# Patient Record
Sex: Male | Born: 1940 | Race: White | Hispanic: No | Smoking: Current every day smoker
Health system: Southern US, Community
[De-identification: ages and names within clinical notes are randomized; demographics above are authoritative.]

## PROBLEM LIST (undated history)

## (undated) DIAGNOSIS — M1990 Unspecified osteoarthritis, unspecified site: Secondary | ICD-10-CM

## (undated) DIAGNOSIS — I10 Essential (primary) hypertension: Secondary | ICD-10-CM

## (undated) DIAGNOSIS — C4490 Unspecified malignant neoplasm of skin, unspecified: Secondary | ICD-10-CM

## (undated) DIAGNOSIS — J449 Chronic obstructive pulmonary disease, unspecified: Secondary | ICD-10-CM

## (undated) DIAGNOSIS — A630 Anogenital (venereal) warts: Secondary | ICD-10-CM

## (undated) DIAGNOSIS — E785 Hyperlipidemia, unspecified: Secondary | ICD-10-CM

## (undated) HISTORY — PX: TONSILLECTOMY: SUR1361

## (undated) HISTORY — DX: Unspecified osteoarthritis, unspecified site: M19.90

## (undated) HISTORY — DX: Essential (primary) hypertension: I10

## (undated) HISTORY — DX: Hyperlipidemia, unspecified: E78.5

## (undated) HISTORY — DX: Chronic obstructive pulmonary disease, unspecified: J44.9

## (undated) HISTORY — DX: Unspecified malignant neoplasm of skin, unspecified: C44.90

## (undated) HISTORY — PX: INGUINAL HERNIA REPAIR: SUR1180

## (undated) HISTORY — DX: Anogenital (venereal) warts: A63.0

## (undated) HISTORY — PX: NASAL SINUS SURGERY: SHX719

---

## 1998-12-29 ENCOUNTER — Emergency Department (HOSPITAL_COMMUNITY): Admission: EM | Admit: 1998-12-29 | Discharge: 1998-12-29 | Payer: Self-pay | Admitting: Emergency Medicine

## 1998-12-30 ENCOUNTER — Encounter: Payer: Self-pay | Admitting: Emergency Medicine

## 2000-09-19 ENCOUNTER — Encounter: Payer: Self-pay | Admitting: Emergency Medicine

## 2000-09-19 ENCOUNTER — Encounter: Admission: RE | Admit: 2000-09-19 | Discharge: 2000-09-19 | Payer: Self-pay | Admitting: Emergency Medicine

## 2001-01-02 ENCOUNTER — Encounter (INDEPENDENT_AMBULATORY_CARE_PROVIDER_SITE_OTHER): Payer: Self-pay | Admitting: Specialist

## 2001-01-02 ENCOUNTER — Ambulatory Visit (HOSPITAL_BASED_OUTPATIENT_CLINIC_OR_DEPARTMENT_OTHER): Admission: RE | Admit: 2001-01-02 | Discharge: 2001-01-02 | Payer: Self-pay | Admitting: Plastic Surgery

## 2001-12-29 ENCOUNTER — Emergency Department (HOSPITAL_COMMUNITY): Admission: EM | Admit: 2001-12-29 | Discharge: 2001-12-29 | Payer: Self-pay

## 2003-02-11 ENCOUNTER — Emergency Department (HOSPITAL_COMMUNITY): Admission: EM | Admit: 2003-02-11 | Discharge: 2003-02-12 | Payer: Self-pay | Admitting: Emergency Medicine

## 2003-02-12 ENCOUNTER — Encounter: Payer: Self-pay | Admitting: Emergency Medicine

## 2006-01-14 ENCOUNTER — Emergency Department (HOSPITAL_COMMUNITY): Admission: EM | Admit: 2006-01-14 | Discharge: 2006-01-15 | Payer: Self-pay | Admitting: Emergency Medicine

## 2006-02-19 ENCOUNTER — Ambulatory Visit (HOSPITAL_COMMUNITY): Admission: RE | Admit: 2006-02-19 | Discharge: 2006-02-19 | Payer: Self-pay | Admitting: Emergency Medicine

## 2007-02-14 ENCOUNTER — Ambulatory Visit (HOSPITAL_COMMUNITY): Admission: RE | Admit: 2007-02-14 | Discharge: 2007-02-14 | Payer: Self-pay | Admitting: Ophthalmology

## 2007-03-28 ENCOUNTER — Ambulatory Visit (HOSPITAL_BASED_OUTPATIENT_CLINIC_OR_DEPARTMENT_OTHER): Admission: RE | Admit: 2007-03-28 | Discharge: 2007-03-28 | Payer: Self-pay | Admitting: Urology

## 2007-03-28 ENCOUNTER — Encounter (INDEPENDENT_AMBULATORY_CARE_PROVIDER_SITE_OTHER): Payer: Self-pay | Admitting: Urology

## 2007-04-25 DIAGNOSIS — A630 Anogenital (venereal) warts: Secondary | ICD-10-CM

## 2007-04-25 HISTORY — PX: OTHER SURGICAL HISTORY: SHX169

## 2007-04-25 HISTORY — DX: Anogenital (venereal) warts: A63.0

## 2007-09-23 ENCOUNTER — Encounter (INDEPENDENT_AMBULATORY_CARE_PROVIDER_SITE_OTHER): Payer: Self-pay | Admitting: Urology

## 2007-09-23 ENCOUNTER — Ambulatory Visit (HOSPITAL_BASED_OUTPATIENT_CLINIC_OR_DEPARTMENT_OTHER): Admission: RE | Admit: 2007-09-23 | Discharge: 2007-09-24 | Payer: Self-pay | Admitting: Urology

## 2008-01-11 ENCOUNTER — Emergency Department (HOSPITAL_COMMUNITY): Admission: EM | Admit: 2008-01-11 | Discharge: 2008-01-11 | Payer: Self-pay | Admitting: Emergency Medicine

## 2008-09-29 ENCOUNTER — Ambulatory Visit: Payer: Self-pay | Admitting: Internal Medicine

## 2008-09-29 ENCOUNTER — Encounter (INDEPENDENT_AMBULATORY_CARE_PROVIDER_SITE_OTHER): Payer: Self-pay | Admitting: *Deleted

## 2008-09-29 DIAGNOSIS — E785 Hyperlipidemia, unspecified: Secondary | ICD-10-CM

## 2008-09-29 DIAGNOSIS — M1990 Unspecified osteoarthritis, unspecified site: Secondary | ICD-10-CM

## 2008-09-29 DIAGNOSIS — J449 Chronic obstructive pulmonary disease, unspecified: Secondary | ICD-10-CM

## 2008-09-29 DIAGNOSIS — Z85828 Personal history of other malignant neoplasm of skin: Secondary | ICD-10-CM

## 2008-09-29 DIAGNOSIS — K5730 Diverticulosis of large intestine without perforation or abscess without bleeding: Secondary | ICD-10-CM | POA: Insufficient documentation

## 2008-09-29 DIAGNOSIS — I10 Essential (primary) hypertension: Secondary | ICD-10-CM

## 2008-09-29 LAB — CONVERTED CEMR LAB: LDL Goal: 130 mg/dL

## 2008-10-27 ENCOUNTER — Ambulatory Visit: Payer: Self-pay | Admitting: Gastroenterology

## 2008-11-09 ENCOUNTER — Telehealth: Payer: Self-pay | Admitting: Internal Medicine

## 2008-11-18 ENCOUNTER — Ambulatory Visit: Payer: Self-pay | Admitting: Gastroenterology

## 2008-11-19 ENCOUNTER — Encounter: Payer: Self-pay | Admitting: Gastroenterology

## 2008-12-02 ENCOUNTER — Ambulatory Visit: Payer: Self-pay | Admitting: Gastroenterology

## 2008-12-04 ENCOUNTER — Encounter: Payer: Self-pay | Admitting: Gastroenterology

## 2008-12-11 ENCOUNTER — Telehealth (INDEPENDENT_AMBULATORY_CARE_PROVIDER_SITE_OTHER): Payer: Self-pay | Admitting: *Deleted

## 2008-12-23 DIAGNOSIS — Z860100 Personal history of colon polyps, unspecified: Secondary | ICD-10-CM | POA: Insufficient documentation

## 2008-12-25 ENCOUNTER — Ambulatory Visit: Payer: Self-pay | Admitting: Gastroenterology

## 2009-04-15 ENCOUNTER — Inpatient Hospital Stay (HOSPITAL_COMMUNITY): Admission: EM | Admit: 2009-04-15 | Discharge: 2009-04-18 | Payer: Self-pay | Admitting: Emergency Medicine

## 2009-04-27 ENCOUNTER — Ambulatory Visit: Payer: Self-pay | Admitting: Internal Medicine

## 2009-04-27 DIAGNOSIS — F17200 Nicotine dependence, unspecified, uncomplicated: Secondary | ICD-10-CM

## 2009-06-09 ENCOUNTER — Ambulatory Visit: Payer: Self-pay | Admitting: Internal Medicine

## 2009-06-09 DIAGNOSIS — N400 Enlarged prostate without lower urinary tract symptoms: Secondary | ICD-10-CM

## 2009-06-09 DIAGNOSIS — D485 Neoplasm of uncertain behavior of skin: Secondary | ICD-10-CM

## 2009-06-10 ENCOUNTER — Encounter: Payer: Self-pay | Admitting: Internal Medicine

## 2009-09-13 ENCOUNTER — Telehealth: Payer: Self-pay | Admitting: Internal Medicine

## 2009-09-24 ENCOUNTER — Ambulatory Visit: Payer: Self-pay | Admitting: Internal Medicine

## 2009-09-24 ENCOUNTER — Telehealth: Payer: Self-pay | Admitting: Internal Medicine

## 2009-09-24 DIAGNOSIS — J441 Chronic obstructive pulmonary disease with (acute) exacerbation: Secondary | ICD-10-CM

## 2009-09-27 ENCOUNTER — Telehealth: Payer: Self-pay | Admitting: Internal Medicine

## 2009-10-11 ENCOUNTER — Ambulatory Visit: Payer: Self-pay | Admitting: Internal Medicine

## 2009-10-11 LAB — CONVERTED CEMR LAB
ALT: 36 units/L (ref 0–53)
AST: 28 units/L (ref 0–37)
BUN: 40 mg/dL — ABNORMAL HIGH (ref 6–23)
Basophils Relative: 0.2 % (ref 0.0–3.0)
Chloride: 105 meq/L (ref 96–112)
Eosinophils Relative: 1.7 % (ref 0.0–5.0)
GFR calc non Af Amer: 59 mL/min (ref >60)
HCT: 41.3 % (ref 39.0–52.0)
Hemoglobin: 14.3 g/dL (ref 13.0–17.0)
Lymphs Abs: 1.5 10*3/uL (ref 0.7–4.0)
MCV: 95.5 fL (ref 78.0–100.0)
Monocytes Absolute: 0.6 10*3/uL (ref 0.1–1.0)
Monocytes Relative: 7.8 % (ref 3.0–12.0)
Neutro Abs: 5.9 10*3/uL (ref 1.4–7.7)
Platelets: 240 10*3/uL (ref 150.0–400.0)
Potassium: 4.4 meq/L (ref 3.5–5.1)
RBC: 4.33 M/uL (ref 4.22–5.81)
Sodium: 142 meq/L (ref 135–145)
TSH: 1.16 microintl units/mL (ref 0.35–5.50)
Total Bilirubin: 0.8 mg/dL (ref 0.3–1.2)
Total CK: 71 units/L (ref 7–232)
Total Protein: 6.8 g/dL (ref 6.0–8.3)
WBC: 8.3 10*3/uL (ref 4.5–10.5)

## 2009-11-10 ENCOUNTER — Encounter (INDEPENDENT_AMBULATORY_CARE_PROVIDER_SITE_OTHER): Payer: Self-pay | Admitting: *Deleted

## 2009-11-23 ENCOUNTER — Telehealth: Payer: Self-pay | Admitting: Internal Medicine

## 2009-12-14 ENCOUNTER — Ambulatory Visit: Payer: Self-pay | Admitting: Internal Medicine

## 2010-01-20 ENCOUNTER — Encounter (INDEPENDENT_AMBULATORY_CARE_PROVIDER_SITE_OTHER): Payer: Self-pay | Admitting: *Deleted

## 2010-02-18 ENCOUNTER — Encounter (INDEPENDENT_AMBULATORY_CARE_PROVIDER_SITE_OTHER): Payer: Self-pay | Admitting: *Deleted

## 2010-02-23 ENCOUNTER — Telehealth: Payer: Self-pay | Admitting: Internal Medicine

## 2010-02-23 ENCOUNTER — Ambulatory Visit: Payer: Self-pay | Admitting: Gastroenterology

## 2010-03-02 ENCOUNTER — Telehealth: Payer: Self-pay | Admitting: Gastroenterology

## 2010-03-07 ENCOUNTER — Ambulatory Visit: Payer: Self-pay | Admitting: Gastroenterology

## 2010-03-09 ENCOUNTER — Encounter: Payer: Self-pay | Admitting: Gastroenterology

## 2010-03-29 ENCOUNTER — Ambulatory Visit: Payer: Self-pay | Admitting: Internal Medicine

## 2010-03-29 ENCOUNTER — Telehealth: Payer: Self-pay | Admitting: Internal Medicine

## 2010-04-15 ENCOUNTER — Telehealth: Payer: Self-pay | Admitting: Internal Medicine

## 2010-05-05 ENCOUNTER — Telehealth: Payer: Self-pay | Admitting: Internal Medicine

## 2010-05-22 LAB — CONVERTED CEMR LAB
ALT: 31 units/L (ref 0–53)
Alkaline Phosphatase: 37 units/L — ABNORMAL LOW (ref 39–117)
BUN: 17 mg/dL (ref 6–23)
Basophils Relative: 0.7 % (ref 0.0–3.0)
Bilirubin Urine: NEGATIVE
Bilirubin, Direct: 0.2 mg/dL (ref 0.0–0.3)
Calcium: 9.5 mg/dL (ref 8.4–10.5)
Creatinine, Ser: 1.1 mg/dL (ref 0.4–1.5)
Eosinophils Absolute: 0.2 10*3/uL (ref 0.0–0.7)
Eosinophils Relative: 4.3 % (ref 0.0–5.0)
HDL: 58.5 mg/dL (ref >39.00)
LDL Cholesterol: 50 mg/dL (ref 0–99)
Leukocytes, UA: NEGATIVE
Lymphocytes Relative: 26.2 % (ref 12.0–46.0)
Neutrophils Relative %: 58.6 % (ref 43.0–77.0)
Nitrite: NEGATIVE
Platelets: 199 10*3/uL (ref 150.0–400.0)
RBC: 4.05 M/uL — ABNORMAL LOW (ref 4.22–5.81)
Specific Gravity, Urine: 1.015 (ref 1.000–1.030)
Total Bilirubin: 0.6 mg/dL (ref 0.3–1.2)
Total CHOL/HDL Ratio: 2
Total Protein: 6.5 g/dL (ref 6.0–8.3)
Triglycerides: 106 mg/dL (ref 0.0–149.0)
VLDL: 21.2 mg/dL (ref 0.0–40.0)
WBC: 4.8 10*3/uL (ref 4.5–10.5)
pH: 7 (ref 5.0–8.0)

## 2010-05-24 NOTE — Letter (Signed)
Summary: Colonoscopy Letter  [facility]  [address] [place], [place] [address]   Phone: [phone]  Fax: [phone]      November 10, 2009 MRN: [id number]   [name] [address] [place], [place]  [address]   Dear Mr. Bowersox,   According to your medical record, it is time for you to schedule a Colonoscopy. The American Cancer Society recommends this procedure as a method to detect early colon cancer. Patients with a family history of colon cancer, or a personal history of colon polyps or inflammatory bowel disease are at increased risk.  This letter has beeen generated based on the recommendations made at the time of your procedure. If you feel that in your particular situation this may no longer apply, please contact our office.  Please call our office at [phone] to schedule this appointment or to update your records at your earliest convenience.  Thank you for cooperating with [place] to provide you with the very best care possible.   Sincerely,    Vania Rea. Jarold Motto, M.D.  [facility] [phone]

## 2010-05-24 NOTE — Letter (Signed)
Summary: Patient Notice- Polyp Results  [facility]  [address] [place], [place] [address]   Phone: [phone]  Fax: [phone]        March 09, 2010 MRN: [id number]    JADRIEL SAXER [address] [place], [place]  [address]    Dear Mr. Oftedahl,  I am pleased to inform you that the colon polyp(s) removed during your recent colonoscopy was (were) found to be benign (no cancer detected) upon pathologic examination.  I recommend you have a repeat colonoscopy examination in 3_ years to look for recurrent polyps, as having colon polyps increases your risk for having recurrent polyps or even colon cancer in the future.  Should you develop new or worsening symptoms of abdominal pain, bowel habit changes or bleeding from the rectum or bowels, please schedule an evaluation with either your primary care physician or with me.  Additional information/recommendations:  xx__ No further action with gastroenterology is needed at this time. Please      follow-up with your primary care physician for your other healthcare      needs.  __ Please call [phone] to schedule a return visit to review your      situation.  __ Please keep your follow-up visit as already scheduled.  __ Continue treatment plan as outlined the day of your exam.  Please call us if you are having persistent problems or have questions about your condition that have not been fully answered at this time.  Sincerely,  Mardella Layman MD [facility]  This letter has been electronically signed by your physician.  Appended Document: Patient Notice- Polyp Results Letter mailed

## 2010-05-24 NOTE — Assessment & Plan Note (Signed)
Summary: YEARLY FU/ LABS AFTER /NWS  #   Vital Signs:  Patient profile:   70 year old male Height:      73 inches Weight:      235 pounds BMI:     31.12 O2 Sat:      98 % on Room air Temp:     98.7 degrees F oral Pulse rate:   80 / minute Pulse rhythm:   regular Resp:     16 per minute BP sitting:   114 / 58  (left arm) Cuff size:   large  Vitals Entered By: Rock Nephew CMA (June 09, 2009 8:49 AM)  Nutrition Counseling: Patient's BMI is greater than 25 and therefore counseled on weight management options.  O2 Flow:  Room air CC: yearly follow up, Hypertension Management, Lipid Management, Preventive Care   Primary Care Provider:  Sanda Linger MD  CC:  yearly follow up, Hypertension Management, Lipid Management, and Preventive Care.  History of Present Illness: He returns for a complete physical. He has a lesion on his left ankle that he is concerned about.  Hypertension History:      He denies headache, chest pain, palpitations, dyspnea with exertion, orthopnea, PND, peripheral edema, visual symptoms, neurologic problems, syncope, and side effects from treatment.  He notes no problems with any antihypertensive medication side effects.        Positive major cardiovascular risk factors include male age 55 years old or older, hyperlipidemia, hypertension, and current tobacco user.  Negative major cardiovascular risk factors include no history of diabetes and negative family history for ischemic heart disease.        Further assessment for target organ damage reveals no history of ASHD, cardiac end-organ damage (CHF/LVH), stroke/TIA, peripheral vascular disease, renal insufficiency, or hypertensive retinopathy.    Lipid Management History:      Positive NCEP/ATP III risk factors include male age 13 years old or older, current tobacco user, and hypertension.  Negative NCEP/ATP III risk factors include non-diabetic, no family history for ischemic heart disease, no ASHD  (atherosclerotic heart disease), no prior stroke/TIA, no peripheral vascular disease, and no history of aortic aneurysm.        The patient states that he knows about the "Therapeutic Lifestyle Change" diet.  His compliance with the TLC diet is good.  The patient expresses understanding of adjunctive measures for cholesterol lowering.  Adjunctive measures started by the patient include aerobic exercise, fiber, limit alcohol consumpton, and weight reduction.  He expresses no side effects from his lipid-lowering medication.  The patient denies any symptoms to suggest myopathy or liver disease.      Preventive Screening-Counseling & Management  Alcohol-Tobacco     Smoking Cessation Counseling: yes  Clinical Review Panels:  Prevention   Last Colonoscopy:  Location:  [facility].  (12/02/2008)  Immunizations   Last Tetanus Booster:  Tdap (11/07/2006)   Last Flu Vaccine:  Fluvax 3+ (02/01/2009)   Last Pneumovax:  given (04/25/2007)  Diabetes Management   Last Flu Vaccine:  Fluvax 3+ (02/01/2009)   Last Pneumovax:  given (04/25/2007)   Current Medications (verified): 1)  Simvastatin 80 Mg Tabs (Simvastatin) .... One Tablet By Mouth Once Daily At Bedtime 2)  Chlorthalidone 25 Mg Tabs (Chlorthalidone) .... One Tablet By Mouth Once Daily 3)  Lotrel 10-40 Mg Caps (Amlodipine Besy-Benazepril Hcl) .... One Tablet By Mouth Once Daily 4)  Aspirin 81 Mg  Tabs (Aspirin) .... One Tablet By Mouth Once Daily 5)  Metanx 3-35-2  Mg .... Once Daily 6)  Ventolin Hfa 108 (90 Base) Mcg/act Aers (Albuterol Sulfate) .Marland Kitchen.. 1-2 Puffs Qid As Needed For Wheezing 7)  Advair Diskus 250-50 Mcg/dose Aepb (Fluticasone-Salmeterol) .Marland Kitchen.. 1 Puff Two Times A Day 8)  Spiriva Handihaler 18 Mcg Caps (Tiotropium Bromide Monohydrate)  Allergies (verified): No Known Drug Allergies  Past History:  Past Medical History: Reviewed history from 09/29/2008 and no changes required. Skin cancer, hx of: BCC on  nose COPD Hyperlipidemia Hypertension Osteoarthritis Genital warts 2009  Past Surgical History: Reviewed history from 09/29/2008 and no changes required. Inguinal herniorrhaphy Sinus surgery Tonsillectomy Wart removal with grafts from scrotum Patsi Sears) in 2009  Family History: Reviewed history from 09/29/2008 and no changes required. adopted  Social History: Reviewed history from 10/27/2008 and no changes required. Retired Married 2 boys Current Smoker Alcohol use-yes: 3-4 every other day  Drug use-no Regular exercise-no Patient does not get regular exercise.  Daily Caffeine Use: 5 cups daily   Review of Systems       The patient complains of weight gain and suspicious skin lesions.  The patient denies anorexia, fever, weight loss, chest pain, peripheral edema, prolonged cough, headaches, hemoptysis, abdominal pain, melena, hematochezia, severe indigestion/heartburn, hematuria, incontinence, difficulty walking, depression, abnormal bleeding, enlarged lymph nodes, angioedema, and testicular masses.   Resp:  Denies chest discomfort, chest pain with inspiration, cough, coughing up blood, pleuritic, shortness of breath, sputum productive, and wheezing. GI:  Denies abdominal pain, bloody stools, change in bowel habits, loss of appetite, nausea, vomiting, and yellowish skin color. GU:  Complains of nocturia; denies discharge, dysuria, hematuria, incontinence, urinary frequency, and urinary hesitancy.  Physical Exam  General:  Well developed, well nourished, no acute distress.healthy appearing.   Head:  Normocephalic and atraumatic. Eyes:  No corneal or conjunctival inflammation noted. EOMI. Perrla. Funduscopic exam benign, without hemorrhages, exudates or papilledema. Vision grossly normal. Ears:  External ear exam shows no significant lesions or deformities.  Otoscopic examination reveals clear canals, tympanic membranes are intact bilaterally without bulging, retraction,  inflammation or discharge. Hearing is grossly normal bilaterally. Nose:  External nasal examination shows no deformity or inflammation. Nasal mucosa are pink and moist without lesions or exudates. Mouth:  Oral mucosa and oropharynx without lesions or exudates.  Teeth in good repair. Neck:  Supple; no masses or thyromegaly.no thyroid nodules or tenderness, no JVD, normal carotid upstroke, no carotid bruits, and no cervical lymphadenopathy.   Chest Wall:  No deformities, masses, tenderness or gynecomastia noted. Breasts:  No masses or gynecomastia noted Lungs:  Normal respiratory effort, chest expands symmetrically. Lungs are clear to auscultation, no crackles or wheezes. Heart:  Normal rate and regular rhythm. S1 and S2 normal without gallop, murmur, click, rub or other extra sounds. Abdomen:  soft, non-tender, normal bowel sounds, no distention, no masses, no guarding, no rigidity, no rebound tenderness, no abdominal hernia, no inguinal hernia, no hepatomegaly, and no splenomegaly.   Rectal:  No external abnormalities noted. Normal sphincter tone. No rectal masses or tenderness. heme negative. Genitalia:  circumcised, no hydrocele, no varicocele, no scrotal masses, no testicular masses or atrophy, no cutaneous lesions, and no urethral discharge.   Prostate:  no nodules, no asymmetry, no induration, and 1+ enlarged.   Msk:  No deformity or scoliosis noted of thoracic or lumbar spine.   Pulses:  R and L carotid,radial,femoral,dorsalis pedis and posterior tibial pulses are full and equal bilaterally Extremities:  trace left pedal edema and trace right pedal edema.   Neurologic:  No cranial  nerve deficits noted. Station and gait are normal. Plantar reflexes are down-going bilaterally. DTRs are symmetrical throughout. Sensory, motor and coordinative functions appear intact. Skin:  over the left lateral ankle there is an 8mm raised lesion with a consistency of a fibroadenoma. turgor normal, color normal,  no rashes, no ecchymoses, no petechiae, and seborrheic keratosis.   Cervical Nodes:  no anterior cervical adenopathy and no posterior cervical adenopathy.   Axillary Nodes:  no R axillary adenopathy and no L axillary adenopathy.   Inguinal Nodes:  no L inguinal adenopathy.   Psych:  Cognition and judgment appear intact. Alert and cooperative with normal attention span and concentration. No apparent delusions, illusions, hallucinations Additional Exam:  EKG is normal.   Impression & Recommendations:  Problem # 1:  NEOPLASM, SKIN, UNCERTAIN BEHAVIOR (ICD-238.2) Assessment New  Orders: Dermatology Referral (Derma)  Problem # 2:  HYPERTROPHY PROSTATE W/UR OBST & OTH LUTS (ICD-600.01) Assessment: New  Orders: Venipuncture (81017) TLB-Lipid Panel (80061-LIPID) TLB-BMP (Basic Metabolic Panel-BMET) (80048-METABOL) TLB-CBC Platelet - w/Differential (85025-CBCD) TLB-Hepatic/Liver Function Pnl (80076-HEPATIC) TLB-TSH (Thyroid Stimulating Hormone) (84443-TSH) TLB-PSA (Prostate Specific Antigen) (84153-PSA) TLB-Udip w/ Micro (81001-URINE) DRE (P1025)  Problem # 3:  TOBACCO USE (ICD-305.1) Assessment: Unchanged  Encouraged smoking cessation and discussed different methods for smoking cessation.   Orders: Tobacco use cessation intermediate 3-10 minutes (85277)  Problem # 4:  HYPERTENSION (ICD-401.9) Assessment: Improved  His updated medication list for this problem includes:    Chlorthalidone 25 Mg Tabs (Chlorthalidone) ..... One tablet by mouth once daily    Lotrel 10-40 Mg Caps (Amlodipine besy-benazepril hcl) ..... One tablet by mouth once daily  Orders: Venipuncture (82423) TLB-Lipid Panel (80061-LIPID) TLB-BMP (Basic Metabolic Panel-BMET) (80048-METABOL) TLB-CBC Platelet - w/Differential (85025-CBCD) TLB-Hepatic/Liver Function Pnl (80076-HEPATIC) TLB-TSH (Thyroid Stimulating Hormone) (84443-TSH) TLB-PSA (Prostate Specific Antigen) (84153-PSA) TLB-Udip w/ Micro  (81001-URINE)  BP today: 114/58 Prior BP: 118/62 (04/27/2009)  Prior 10 Yr Risk Heart Disease: Not enough information (09/29/2008)  Problem # 5:  COPD (ICD-496) Assessment: Improved  His updated medication list for this problem includes:    Ventolin Hfa 108 (90 Base) Mcg/act Aers (Albuterol sulfate) .Marland Kitchen... 1-2 puffs qid as needed for wheezing    Advair Diskus 250-50 Mcg/dose Aepb (Fluticasone-salmeterol) .Marland Kitchen... 1 puff two times a day    Spiriva Handihaler 18 Mcg Caps (Tiotropium bromide monohydrate)  Orders: Venipuncture (53614) TLB-Lipid Panel (80061-LIPID) TLB-BMP (Basic Metabolic Panel-BMET) (80048-METABOL) TLB-CBC Platelet - w/Differential (85025-CBCD) TLB-Hepatic/Liver Function Pnl (80076-HEPATIC) TLB-TSH (Thyroid Stimulating Hormone) (84443-TSH) TLB-PSA (Prostate Specific Antigen) (84153-PSA) TLB-Udip w/ Micro (81001-URINE) Tobacco use cessation intermediate 3-10 minutes (43154)  Pulmonary Functions Reviewed: O2 sat: 98 (06/09/2009)     Vaccines Reviewed: Pneumovax: given (04/25/2007)   Flu Vax: Fluvax 3+ (02/01/2009)  Problem # 6:  HYPERLIPIDEMIA (ICD-272.4) Assessment: Unchanged  His updated medication list for this problem includes:    Simvastatin 80 Mg Tabs (Simvastatin) ..... One tablet by mouth once daily at bedtime  Orders: Venipuncture (00867) TLB-Lipid Panel (80061-LIPID) TLB-BMP (Basic Metabolic Panel-BMET) (80048-METABOL) TLB-CBC Platelet - w/Differential (85025-CBCD) TLB-Hepatic/Liver Function Pnl (80076-HEPATIC) TLB-TSH (Thyroid Stimulating Hormone) (84443-TSH) TLB-PSA (Prostate Specific Antigen) (84153-PSA) TLB-Udip w/ Micro (81001-URINE)  Lipid Goals: Chol Goal: 200 (09/29/2008)   HDL Goal: 40 (09/29/2008)   LDL Goal: 130 (09/29/2008)   TG Goal: 150 (09/29/2008)  Prior 10 Yr Risk Heart Disease: Not enough information (09/29/2008)  Complete Medication List: 1)  Simvastatin 80 Mg Tabs (Simvastatin) .... One tablet by mouth once daily at  bedtime 2)  Chlorthalidone 25 Mg Tabs (Chlorthalidone) .... One  tablet by mouth once daily 3)  Lotrel 10-40 Mg Caps (Amlodipine besy-benazepril hcl) .... One tablet by mouth once daily 4)  Aspirin 81 Mg Tabs (Aspirin) .... One tablet by mouth once daily 5)  Metanx 3-35-2 Mg  .... Once daily 6)  Ventolin Hfa 108 (90 Base) Mcg/act Aers (Albuterol sulfate) .Marland Kitchen.. 1-2 puffs qid as needed for wheezing 7)  Advair Diskus 250-50 Mcg/dose Aepb (Fluticasone-salmeterol) .Marland Kitchen.. 1 puff two times a day 8)  Spiriva Handihaler 18 Mcg Caps (Tiotropium bromide monohydrate)  Other Orders: Hemoccult Guaiac-1 spec.(in office) (82270)  Hypertension Assessment/Plan:      The patient's hypertensive risk group is category B: At least one risk factor (excluding diabetes) with no target organ damage.  Today's blood pressure is 114/58.  His blood pressure goal is < 140/90.  Lipid Assessment/Plan:      Based on NCEP/ATP III, the patient's risk factor category is "2 or more risk factors and a calculated 10 year CAD risk of > 20%".  The patient's lipid goals are as follows: Total cholesterol goal is 200; LDL cholesterol goal is 130; HDL cholesterol goal is 40; Triglyceride goal is 150.    Colorectal Screening:  Current Recommendations:    Hemoccult: NEG X 1 today  PSA Screening:    Reviewed PSA screening recommendations: PSA ordered  Immunization & Chemoprophylaxis:    Varicella vaccine: Varicella  (11/05/2007)    Tetanus vaccine: Tdap  (11/07/2006)    Influenza vaccine: Fluvax 3+  (02/01/2009)    Pneumovax: given  (04/25/2007)  Patient Instructions: 1)  Please schedule a follow-up appointment in 2 months. 2)  Tobacco is very bad for your health and your loved ones! You Should stop smoking!. 3)  Stop Smoking Tips: Choose a Quit date. Cut down before the Quit date. decide what you will do as a substitute when you feel the urge to smoke(gum,toothpick,exercise). 4)  It is important that you exercise regularly at least  20 minutes 5 times a week. If you develop chest pain, have severe difficulty breathing, or feel very tired , stop exercising immediately and seek medical attention. 5)  You need to lose weight. Consider a lower calorie diet and regular exercise.  6)  Check your Blood Pressure regularly. If it is above 130/80: you should make an appointment.     Influenza Immunization History:    Influenza # 1:  Fluvax 3+ (02/01/2009)

## 2010-05-24 NOTE — Procedures (Signed)
Summary: Colonoscopy  Patient: [name] Note: All result statuses are Final unless otherwise noted.  Tests: (1) Colonoscopy (COL)   COL Colonoscopy           DONE     [facility]     [address]     [place], [place]  [address]           COLONOSCOPY PROCEDURE REPORT           PATIENT:  [name], [name]  MR#:  [id number]     BIRTHDATE:  [birth year], 69 yrs. old  GENDER:  male     ENDOSCOPIST:  Vania Rea. Jarold Motto, MD, [facility]     REF. BY:  Etta Grandchild, M.D.     PROCEDURE DATE:  03/07/2010     PROCEDURE:  Colonoscopy with snare polypectomy     ASA CLASS:  Class II     INDICATIONS:  history of pre-cancerous (adenomatous) colon polyps           MEDICATIONS:   Fentanyl 25 mcg IV, Versed 6 mg IV           DESCRIPTION OF PROCEDURE:   After the risks benefits and     alternatives of the procedure were thoroughly explained, informed     consent was obtained.  Digital rectal exam was performed and     revealed no abnormalities.   The LB [id number] endoscope     was introduced through the anus and advanced to the cecum, which     was identified by both the appendix and ileocecal valve, without     limitations.  The quality of the prep was excellent, using     MoviPrep.  The instrument was then slowly withdrawn as the colon     was fully examined.     <<PROCEDUREIMAGES>>           FINDINGS:  ULTRASONIC FINDINGS:  There were multiple polyps     identified and removed. in the right colon. -3 MM FLAT POLYPS COLD     SNARE EXCISED.#1 JAR. Severe diverticulosis was found in the     sigmoid to descending colon segments.  A pedunculated polyp was     found in the sigmoid colon. THICKED BASED POLYP HOT SNARE     EXCISED.JAR #2.SEE P[ICTURES.   Retroflexed views in the rectum     revealed no abnormalities.    The scope was then withdrawn from     the patient and the procedure completed.           COMPLICATIONS:  None     ENDOSCOPIC IMPRESSION:     1) Polyps, multiple in the right  colon     2) Severe diverticulosis in the sigmoid to descending colon     segments     3) Pedunculated polyp in the sigmoid colon     1.R/O RIGHT COLON ADENOMAS.     2.??? STALK OR NEW POLYP.     3.DIVERTICULOSIS.     RECOMMENDATIONS:     1) high fiber diet     2) Repeat Colonoscopy in 3 years.     REPEAT EXAM:  No           ______________________________     Vania Rea. Jarold Motto, MD, [facility]           CC:           n.     eSIGNED:   Vania Rea. Patterson at 03/07/2010 11:32 AM  Parag, [place], [id number]  Note: An exclamation mark (!) indicates a result that was not dispersed into the flowsheet. Document Creation Date: 03/07/2010 11:33 AM _______________________________________________________________________  (1) Order result status: Final Collection or observation date-time: 03/07/2010 11:23 Requested date-time:  Receipt date-time:  Reported date-time:  Referring Physician:   Ordering Physician: Sheryn Bison [phone]) Specimen Source:  Source: Launa Grill Order Number: [id number] Lab site:   Appended Document: Colonoscopy 3y f/u  Appended Document: Colonoscopy     Procedures Next Due Date:    Colonoscopy: 02/2013

## 2010-05-24 NOTE — Progress Notes (Signed)
  Phone Note Call from Patient Call back at Home Phone [phone]   Caller: Patient Summary of Call: Patient stopped by and filled out walk in sheet  requestingrx for crestor 20mg  (almost out of samples). Patient also states that he feels that dosage may need to be increased. He provided BP reading of 109/50,164/84 and 134/62 Initial call taken by: Rock Nephew CMA,  February 23, 2010 1:43 PM    Prescriptions: CRESTOR 20 MG TABS (ROSUVASTATIN CALCIUM) One by mouth once daily  #30 x 11   Entered and Authorized by:   Etta Grandchild MD   Signed by:   Etta Grandchild MD on 02/23/2010   Method used:   Electronically to        [facility]* (retail)       [address] [place], [place]  [address]       Ph: [phone] or [phone]       Fax: [phone]   RxID:   [id number]

## 2010-05-24 NOTE — Letter (Signed)
Summary: Results Follow-up Letter  [facility]  [address] [place], [place] [address]   Phone: [phone]  Fax: [phone]    06/10/2009  [address] [place], [place]  [address]  Dear Mr. Mandel,   The following are the results of your recent test(s):  Test     Result     Prostate     normal Thyroid     normal Liver/kidney   normal CBC       normal Urine       normal   _________________________________________________________  Please call for an appointment as directed _________________________________________________________ _________________________________________________________ _________________________________________________________  Sincerely,  Sanda Linger MD [facility]

## 2010-05-24 NOTE — Assessment & Plan Note (Signed)
Summary: chest congestion/SD   Vital Signs:  Patient profile:   70 year old male Height:      73 inches Weight:      230 pounds BMI:     30.45 O2 Sat:      94 % on Room air Temp:     98.2 degrees F oral Pulse rate:   90 / minute Pulse rhythm:   regular Resp:     16 per minute BP sitting:   120 / 62  (left arm) Cuff size:   large  Vitals Entered By: Rock Nephew CMA (March 29, 2010 10:50 AM)  Nutrition Counseling: Patient's BMI is greater than 25 and therefore counseled on weight management options.  O2 Flow:  Room air CC: Patient c/o chest congestion and cough w/green phlem, URI symptoms, Hypertension Management Is Patient Diabetic? No Pain Assessment Patient in pain? no       Does patient need assistance? Functional Status Self care Ambulation Normal   Primary Care Provider:  Sanda Linger MD  CC:  Patient c/o chest congestion and cough w/green phlem, URI symptoms, and Hypertension Management.  History of Present Illness:  URI Symptoms      This is a 70 year old man who presents with URI symptoms.  The symptoms began 1 week ago.  The severity is described as mild.  The patient reports nasal congestion, sore throat, and productive cough, but denies clear nasal discharge, purulent nasal discharge, dry cough, earache, and sick contacts.  Associated symptoms include dyspnea.  The patient denies fever, stiff neck, wheezing, rash, vomiting, diarrhea, use of an antipyretic, and response to antipyretic.  The patient also reports muscle aches and severe fatigue.  The patient denies itchy throat, sneezing, and headache.  The patient denies the following risk factors for Strep sinusitis: unilateral facial pain, unilateral nasal discharge, poor response to decongestant, double sickening, tooth pain, Strep exposure, tender adenopathy, and absence of cough.    Hypertension History:      He denies headache, chest pain, palpitations, dyspnea with exertion, orthopnea, PND, peripheral  edema, visual symptoms, neurologic problems, syncope, and side effects from treatment.  He notes no problems with any antihypertensive medication side effects.        Positive major cardiovascular risk factors include male age 36 years old or older, hyperlipidemia, hypertension, and current tobacco user.  Negative major cardiovascular risk factors include no history of diabetes and negative family history for ischemic heart disease.        Further assessment for target organ damage reveals no history of ASHD, cardiac end-organ damage (CHF/LVH), stroke/TIA, peripheral vascular disease, renal insufficiency, or hypertensive retinopathy.     Preventive Screening-Counseling & Management  Alcohol-Tobacco     Alcohol drinks/day: 1     Alcohol type: spirits     >5/day in last 3 mos: no     Alcohol Counseling: not indicated; use of alcohol is not excessive or problematic     Feels need to cut down: no     Feels annoyed by complaints: no     Feels guilty re: drinking: no     Needs 'eye opener' in am: no     Smoking Status: current     Smoking Cessation Counseling: yes     Smoke Cessation Stage: precontemplative     Packs/Day: 1.0     Year Started: 1955     Pack years: 82     Tobacco Counseling: to quit use of tobacco products  Hep-HIV-STD-Contraception  Hepatitis Risk: no risk noted     HIV Risk: no risk noted     STD Risk: no risk noted      Sexual History:  currently monogamous.        Drug Use:  no.        Blood Transfusions:  no.    Clinical Review Panels:  Prevention   Last Colonoscopy:  DONE (03/07/2010)   Last PSA:  1.19 (06/09/2009)  Immunizations   Last Tetanus Booster:  Tdap (11/07/2006)   Last Flu Vaccine:  Fluvax 3+ (03/29/2010)   Last Pneumovax:  given (04/25/2007)  Lipid Management   Cholesterol:  130 (06/09/2009)   LDL (bad choesterol):  50 (06/09/2009)   HDL (good cholesterol):  58.50 (06/09/2009)  Diabetes Management   Creatinine:  1.3 (10/11/2009)    Last Flu Vaccine:  Fluvax 3+ (03/29/2010)   Last Pneumovax:  given (04/25/2007)  CBC   WBC:  8.3 (10/11/2009)   RBC:  4.33 (10/11/2009)   Hgb:  14.3 (10/11/2009)   Hct:  41.3 (10/11/2009)   Platelets:  240.0 (10/11/2009)   MCV  95.5 (10/11/2009)   MCHC  34.5 (10/11/2009)   RDW  13.2 (10/11/2009)   PMN:  71.9 (10/11/2009)   Lymphs:  18.4 (10/11/2009)   Monos:  7.8 (10/11/2009)   Eosinophils:  1.7 (10/11/2009)   Basophil:  0.2 (10/11/2009)  Complete Metabolic Panel   Glucose:  90 (10/11/2009)   Sodium:  142 (10/11/2009)   Potassium:  4.4 (10/11/2009)   Chloride:  105 (10/11/2009)   CO2:  30 (10/11/2009)   BUN:  40 (10/11/2009)   Creatinine:  1.3 (10/11/2009)   Albumin:  4.5 (10/11/2009)   Total Protein:  6.8 (10/11/2009)   Calcium:  9.7 (10/11/2009)   Total Bili:  0.8 (10/11/2009)   Alk Phos:  33 (10/11/2009)   SGPT (ALT):  36 (10/11/2009)   SGOT (AST):  28 (10/11/2009)   Medications Prior to Update: 1)  Chlorthalidone 25 Mg Tabs (Chlorthalidone) .... One Tablet By Mouth Once Daily 2)  Aspirin 81 Mg  Tabs (Aspirin) .... One Tablet By Mouth Once Daily 3)  Metanx 3-35-2 Mg .... Once Daily 4)  Ventolin Hfa 108 (90 Base) Mcg/act Aers (Albuterol Sulfate) .Marland Kitchen.. 1-2 Puffs Qid As Needed For Wheezing 5)  Spiriva Handihaler 18 Mcg Caps (Tiotropium Bromide Monohydrate) .... Inhale By Mouth 1 Capsule Via Handihaler Once Daily 6)  Neuropath-B .... Take 1 Tablet By Mouth Once A Day 7)  Crestor 20 Mg Tabs (Rosuvastatin Calcium) .... One By Mouth Once Daily  Current Medications (verified): 1)  Chlorthalidone 25 Mg Tabs (Chlorthalidone) .... One Tablet By Mouth Once Daily 2)  Aspirin 81 Mg  Tabs (Aspirin) .... One Tablet By Mouth Once Daily 3)  Metanx 3-35-2 Mg .... Once Daily 4)  Ventolin Hfa 108 (90 Base) Mcg/act Aers (Albuterol Sulfate) .Marland Kitchen.. 1-2 Puffs Qid As Needed For Wheezing 5)  Spiriva Handihaler 18 Mcg Caps (Tiotropium Bromide Monohydrate) .... Inhale By Mouth 1 Capsule Via  Handihaler Once Daily 6)  Neuropath-B .... Take 1 Tablet By Mouth Once A Day 7)  Crestor 20 Mg Tabs (Rosuvastatin Calcium) .... One By Mouth Once Daily 8)  Advair Diskus 250-50 Mcg/dose Aepb (Fluticasone-Salmeterol) .... As Directed 9)  Avelox 400 Mg Tabs (Moxifloxacin Hcl) .... One By Mouth Once Daily For 7 Days  Allergies (verified): No Known Drug Allergies  Past History:  Past Medical History: Last updated: 09/29/2008 Skin cancer, hx of: BCC on nose COPD Hyperlipidemia Hypertension Osteoarthritis Genital warts  2009  Past Surgical History: Last updated: 09/29/2008 Inguinal herniorrhaphy Sinus surgery Tonsillectomy Wart removal with grafts from scrotum Patsi Sears) in 2009  Family History: Last updated: 09/29/2008 adopted  Social History: Last updated: 10/27/2008 Retired Married 2 boys Current Smoker Alcohol use-yes: 3-4 every other day  Drug use-no Regular exercise-no Patient does not get regular exercise.  Daily Caffeine Use: 5 cups daily   Risk Factors: Alcohol Use: 1 (03/29/2010) >5 drinks/d w/in last 3 months: no (03/29/2010) Exercise: no (10/11/2009)  Risk Factors: Smoking Status: current (03/29/2010) Packs/Day: 1.0 (03/29/2010)  Family History: Reviewed history from 09/29/2008 and no changes required. adopted  Social History: Reviewed history from 10/27/2008 and no changes required. Retired Married 2 boys Current Smoker Alcohol use-yes: 3-4 every other day  Drug use-no Regular exercise-no Patient does not get regular exercise.  Daily Caffeine Use: 5 cups daily   Review of Systems  The patient denies anorexia, fever, weight loss, weight gain, hoarseness, chest pain, syncope, dyspnea on exertion, peripheral edema, headaches, hemoptysis, abdominal pain, hematuria, suspicious skin lesions, enlarged lymph nodes, and angioedema.   General:  Complains of chills and fatigue; denies fever, loss of appetite, malaise, sleep disorder, and  sweats.  Physical Exam  General:  Well developed, well nourished, no acute distress.healthy appearing.   Head:  Normocephalic and atraumatic. Mouth:  Oral mucosa and oropharynx without lesions or exudates.  Teeth in good repair. Neck:  Supple; no masses or thyromegaly.no thyroid nodules or tenderness, no JVD, normal carotid upstroke, no carotid bruits, and no cervical lymphadenopathy.   Lungs:  normal respiratory effort, no intercostal retractions, no accessory muscle use, no dullness, no fremitus, no wheezes, and L base crackles.   Heart:  normal rate, regular rhythm, no murmur, no gallop, no rub, and no JVD.   Abdomen:  soft, non-tender, normal bowel sounds, no distention, no masses, no guarding, no rigidity, no rebound tenderness, no abdominal hernia, no inguinal hernia, no hepatomegaly, and no splenomegaly.   Msk:  No deformity or scoliosis noted of thoracic or lumbar spine.   Pulses:  R and L carotid,radial,femoral,dorsalis pedis and posterior tibial pulses are full and equal bilaterally Extremities:  No clubbing, cyanosis, edema, or deformity noted with normal full range of motion of all joints.   Neurologic:  No cranial nerve deficits noted. Station and gait are normal. Plantar reflexes are down-going bilaterally. DTRs are symmetrical throughout. Sensory, motor and coordinative functions appear intact. Skin:  Intact without suspicious lesions or rashes Cervical Nodes:  no anterior cervical adenopathy and no posterior cervical adenopathy.   Axillary Nodes:  no R axillary adenopathy and no L axillary adenopathy.   Psych:  Cognition and judgment appear intact. Alert and cooperative with normal attention span and concentration. No apparent delusions, illusions, hallucinations   Impression & Recommendations:  Problem # 1:  BRONCHITIS-ACUTE (ICD-466.0) Assessment New  His updated medication list for this problem includes:    Ventolin Hfa 108 (90 Base) Mcg/act Aers (Albuterol sulfate)  .Marland Kitchen... 1-2 puffs qid as needed for wheezing    Spiriva Handihaler 18 Mcg Caps (Tiotropium bromide monohydrate) ..... Inhale by mouth 1 capsule via handihaler once daily    Advair Diskus 250-50 Mcg/dose Aepb (Fluticasone-salmeterol) .Marland Kitchen... As directed    Avelox 400 Mg Tabs (Moxifloxacin hcl) ..... One by mouth once daily for 7 days  Take antibiotics and other medications as directed. Encouraged to push clear liquids, get enough rest, and take acetaminophen as needed. To be seen in 5-7 days if no improvement, sooner if worse.  Problem # 2:  COUGH (ICD-786.2) Assessment: New will check for pna Orders: T-2 View CXR (71020TC) T-2 View CXR (71020TC)  Problem # 3:  CHRONIC OBSTRUCTIVE PULMONARY DISEASE, ACUTE EXACERBATION (ICD-491.21) Assessment: Unchanged  Problem # 4:  HYPERTENSION (ICD-401.9) Assessment: Improved  His updated medication list for this problem includes:    Chlorthalidone 25 Mg Tabs (Chlorthalidone) ..... One tablet by mouth once daily  BP today: 120/62 Prior BP: 134/62 (12/14/2009)  Prior 10 Yr Risk Heart Disease: 18 % (12/14/2009)  Labs Reviewed: K+: 4.4 (10/11/2009) Creat: : 1.3 (10/11/2009)   Chol: 130 (06/09/2009)   HDL: 58.50 (06/09/2009)   LDL: 50 (06/09/2009)   TG: 106.0 (06/09/2009)  Complete Medication List: 1)  Chlorthalidone 25 Mg Tabs (Chlorthalidone) .... One tablet by mouth once daily 2)  Aspirin 81 Mg Tabs (Aspirin) .... One tablet by mouth once daily 3)  Metanx 3-35-2 Mg  .... Once daily 4)  Ventolin Hfa 108 (90 Base) Mcg/act Aers (Albuterol sulfate) .Marland Kitchen.. 1-2 puffs qid as needed for wheezing 5)  Spiriva Handihaler 18 Mcg Caps (Tiotropium bromide monohydrate) .... Inhale by mouth 1 capsule via handihaler once daily 6)  Neuropath-b  .... Take 1 tablet by mouth once a day 7)  Crestor 20 Mg Tabs (Rosuvastatin calcium) .... One by mouth once daily 8)  Advair Diskus 250-50 Mcg/dose Aepb (Fluticasone-salmeterol) .... As directed 9)  Avelox 400 Mg Tabs  (Moxifloxacin hcl) .... One by mouth once daily for 7 days  Other Orders: Flu Vaccine 42yrs + MEDICARE PATIENTS (N5621) Administration Flu vaccine - MCR (H0865)  Hypertension Assessment/Plan:      The patient's hypertensive risk group is category B: At least one risk factor (excluding diabetes) with no target organ damage.  His calculated 10 year risk of coronary heart disease is 14 %.  Today's blood pressure is 120/62.  His blood pressure goal is < 140/90.  Patient Instructions: 1)  Please schedule a follow-up appointment in 1 month. 2)  Take your antibiotic as prescribed until ALL of it is gone, but stop if you develop a rash or swelling and contact our office as soon as possible. 3)  Acute bronchitis symptoms for less than 10 days are not helped by antibiotics. take over the counter cough medications. call if no improvment in  5-7 days, sooner if increasing cough, fever, or new symptoms( shortness of breath, chest pain). Prescriptions: AVELOX 400 MG TABS (MOXIFLOXACIN HCL) One by mouth once daily for 7 days  #7 x 0   Entered and Authorized by:   Etta Grandchild MD   Signed by:   Etta Grandchild MD on 03/29/2010   Method used:   Samples Given   RxID:   [id number]    Orders Added: 1)  T-2 View CXR [71020TC] 2)  T-2 View CXR [71020TC] 3)  Flu Vaccine 47yrs + MEDICARE PATIENTS [Q2039] 4)  Administration Flu vaccine - MCR [G0008] 5)  Est. Patient Level IV [40102]  Flu Vaccine Consent Questions     Do you have a history of severe allergic reactions to this vaccine? no    Any prior history of allergic reactions to egg and/or gelatin? no    Do you have a sensitivity to the preservative Thimersol? no    Do you have a past history of Guillan-Barre Syndrome? no    Do you currently have an acute febrile illness? no    Have you ever had a severe reaction to latex? no    Vaccine information given and  explained to patient? yes    Are you currently pregnant? no    Lot Number:[id number]    Exp Date:10/22/2010   Site Given  Left Deltoid IM    [email or web address]

## 2010-05-24 NOTE — Letter (Signed)
Summary: Moviprep Instructions  [facility]  [address]   [place], [place] [address]   Phone: [phone]  Fax: [phone]       [name]    [birth year]    MRN: [id number]        Procedure Day /Date: Monday, 03-07-10     Arrival Time: 9:30 a.m.      Procedure Time: 10:30 a.m.     Location of Procedure:                    x   [facility] (4th Floor)   PREPARATION FOR COLONOSCOPY WITH MOVIPREP   Starting 5 days prior to your procedure 03-02-10 do not eat nuts, seeds, popcorn, corn, beans, peas,  salads, or any raw vegetables.  Do not take any fiber supplements (e.g. Metamucil, Citrucel, and Benefiber).  THE DAY BEFORE YOUR PROCEDURE         DATE: 03-06-10  DAY: Sunday  1.  Drink clear liquids the entire day-NO SOLID FOOD  2.  Do not drink anything colored red or purple.  Avoid juices with pulp.  No orange juice.  3.  Drink at least 64 oz. (8 glasses) of fluid/clear liquids during the day to prevent dehydration and help the prep work efficiently.  CLEAR LIQUIDS INCLUDE: Water Jello Ice Popsicles Tea (sugar ok, no milk/cream) Powdered fruit flavored drinks Coffee (sugar ok, no milk/cream) Gatorade Juice: apple, white grape, white cranberry  Lemonade Clear bullion, consomm, broth Carbonated beverages (any kind) Strained chicken noodle soup Hard Candy                             4.  In the morning, mix first dose of MoviPrep solution:    Empty 1 Pouch A and 1 Pouch B into the disposable container    Add lukewarm drinking water to the top line of the container. Mix to dissolve    Refrigerate (mixed solution should be used within 24 hrs)  5.  Begin drinking the prep at 5:00 p.m. The MoviPrep container is divided by 4 marks.   Every 15 minutes drink the solution down to the next mark (approximately 8 oz) until the full liter is complete.   6.  Follow completed prep with 16 oz of clear liquid of your choice (Nothing red or purple).   Continue to drink clear liquids until bedtime.  7.  Before going to bed, mix second dose of MoviPrep solution:    Empty 1 Pouch A and 1 Pouch B into the disposable container    Add lukewarm drinking water to the top line of the container. Mix to dissolve    Refrigerate  THE DAY OF YOUR PROCEDURE      DATE: 03-07-10  DAY: Monday  Beginning at 5:30 a.m. (5 hours before procedure):         1. Every 15 minutes, drink the solution down to the next mark (approx 8 oz) until the full liter is complete.  2. Follow completed prep with 16 oz. of clear liquid of your choice.    3. You may drink clear liquids until  8:30 a.m.  (2 HOURS BEFORE PROCEDURE).   MEDICATION INSTRUCTIONS  Unless otherwise instructed, you should take regular prescription medications with a small sip of water   as early as possible the morning of your procedure.        OTHER INSTRUCTIONS  You will need a responsible  adult at least 71 years of age to accompany you and drive you home.   This person must remain in the waiting room during your procedure.  Wear loose fitting clothing that is easily removed.  Leave jewelry and other valuables at home.  However, you may wish to bring a book to read or  an iPod/MP3 player to listen to music as you wait for your procedure to start.  Remove all body piercing jewelry and leave at home.  Total time from sign-in until discharge is approximately 2-3 hours.  You should go home directly after your procedure and rest.  You can resume normal activities the  day after your procedure.  The day of your procedure you should not:   Drive   Make legal decisions   Operate machinery   Drink alcohol   Return to work  You will receive specific instructions about eating, activities and medications before you leave.    The above instructions have been reviewed and explained to me by   Wyona Almas RN  February 23, 2010 1:18 PM     I fully understand and can  verbalize these instructions _____________________________ Date _________

## 2010-05-24 NOTE — Assessment & Plan Note (Signed)
Summary: POST HOSP/ HAVING [facility]INCE LEAVING HOSP/[facility]   Vital Signs:  Patient profile:   71 year old male Height:      73 inches Weight:      228 pounds BMI:     30.19 O2 Sat:      97 % on Room air Temp:     97.6 degrees F oral Pulse rate:   83 / minute Pulse rhythm:   regular Resp:     16 per minute BP sitting:   118 / 62  (right arm) Cuff size:   large  Vitals Entered By: Rock Nephew CMA (April 27, 2009 8:33 AM)  O2 Flow:  Room air CC: hospital follow up, Hypertension Management, Lipid Management Is Patient Diabetic? No   Primary Care Provider:  Sanda Linger MD  CC:  hospital follow up, Hypertension Management, and Lipid Management.  History of Present Illness: He returns for f/up after an admission for viral URI with exacerbation of COPD. He had a few days of diarrhea after the admission but that has resolved. He's concerned about a scaly place on the right side of his nose and is concerned about a recurrence of BCC.  Hypertension History:      He denies headache, chest pain, palpitations, orthopnea, PND, peripheral edema, visual symptoms, neurologic problems, syncope, and side effects from treatment.  He notes no problems with any antihypertensive medication side effects.        Positive major cardiovascular risk factors include male age 10 years old or older, hyperlipidemia, hypertension, and current tobacco user.  Negative major cardiovascular risk factors include no history of diabetes and negative family history for ischemic heart disease.        Further assessment for target organ damage reveals no history of ASHD, cardiac end-organ damage (CHF/LVH), stroke/TIA, peripheral vascular disease, renal insufficiency, or hypertensive retinopathy.    Lipid Management History:      Positive NCEP/ATP III risk factors include male age 22 years old or older, current tobacco user, and hypertension.  Negative NCEP/ATP III risk factors include non-diabetic, no family history  for ischemic heart disease, no ASHD (atherosclerotic heart disease), no prior stroke/TIA, no peripheral vascular disease, and no history of aortic aneurysm.        The patient states that he knows about the "Therapeutic Lifestyle Change" diet.  His compliance with the TLC diet is good.  The patient expresses understanding of adjunctive measures for cholesterol lowering.  Adjunctive measures started by the patient include fiber, ASA, limit alcohol consumpton, and weight reduction.  He expresses no side effects from his lipid-lowering medication.  The patient denies any symptoms to suggest myopathy or liver disease.      Preventive Screening-Counseling & Management  Alcohol-Tobacco     Smoking Cessation Counseling: yes  Current Medications (verified): 1)  Simvastatin 80 Mg Tabs (Simvastatin) .... One Tablet By Mouth Once Daily At Bedtime 2)  Chlorthalidone 25 Mg Tabs (Chlorthalidone) .... One Tablet By Mouth Once Daily 3)  Lotrel 10-40 Mg Caps (Amlodipine Besy-Benazepril Hcl) .... One Tablet By Mouth Once Daily 4)  Aspirin 81 Mg  Tabs (Aspirin) .... One Tablet By Mouth Once Daily 5)  Metanx 3-35-2 Mg .... Once Daily 6)  Ventolin Hfa 108 (90 Base) Mcg/act Aers (Albuterol Sulfate) .Marland Kitchen.. 1-2 Puffs Qid As Needed For Wheezing 7)  Advair Diskus 250-50 Mcg/dose Aepb (Fluticasone-Salmeterol) .Marland Kitchen.. 1 Puff Two Times A Day 8)  Avelox 400 Mg Tabs (Moxifloxacin Hcl) .... Every 24hours 9)  Spiriva Handihaler  18 Mcg Caps (Tiotropium Bromide Monohydrate) 10)  Prednisone 10 Mg Tabs (Prednisone) .... Take 1 Tablet By Mouth Once A Day  Allergies (verified): No Known Drug Allergies  Past History:  Past Medical History: Reviewed history from 09/29/2008 and no changes required. Skin cancer, hx of: BCC on nose COPD Hyperlipidemia Hypertension Osteoarthritis Genital warts 2009  Past Surgical History: Reviewed history from 09/29/2008 and no changes required. Inguinal herniorrhaphy Sinus  surgery Tonsillectomy Wart removal with grafts from scrotum Patsi Sears) in 2009  Family History: Reviewed history from 09/29/2008 and no changes required. adopted  Social History: Reviewed history from 10/27/2008 and no changes required. Retired Married 2 boys Current Smoker Alcohol use-yes: 3-4 every other day  Drug use-no Regular exercise-no Patient does not get regular exercise.  Daily Caffeine Use: 5 cups daily   Review of Systems       The patient complains of suspicious skin lesions.  The patient denies anorexia, fever, weight loss, weight gain, chest pain, syncope, peripheral edema, prolonged cough, headaches, hemoptysis, abdominal pain, melena, hematochezia, severe indigestion/heartburn, enlarged lymph nodes, and angioedema.    Physical Exam  General:  Well developed, well nourished, no acute distress.healthy appearing.   Head:  Normocephalic and atraumatic. Mouth:  Oral mucosa and oropharynx without lesions or exudates.  Teeth in good repair. Neck:  Supple; no masses or thyromegaly. Lungs:  normal respiratory effort, no intercostal retractions, no accessory muscle use, normal breath sounds, no dullness, no crackles, and no wheezes.   Heart:  normal rate, regular rhythm, no murmur, no gallop, no rub, and no JVD.   Abdomen:  Bowel sounds positive,abdomen soft and non-tender without masses, organomegaly or hernias noted. Pulses:  R and L carotid,radial,femoral,dorsalis pedis and posterior tibial pulses are full and equal bilaterally Extremities:  No clubbing, cyanosis, edema, or deformity noted with normal full range of motion of all joints.   Neurologic:  No cranial nerve deficits noted. Station and gait are normal. Plantar reflexes are down-going bilaterally. DTRs are symmetrical throughout. Sensory, motor and coordinative functions appear intact. Skin:  there is a midline scar on his nose, he has a scaly brown lesion on the right side of the nose. Cervical Nodes:  no  anterior cervical adenopathy and no posterior cervical adenopathy.   Axillary Nodes:  no R axillary adenopathy and no L axillary adenopathy.   Inguinal Nodes:  no R inguinal adenopathy and no L inguinal adenopathy.   Psych:  Alert and cooperative. Normal mood and affect.   Impression & Recommendations:  Problem # 1:  HYPERTENSION (ICD-401.9) Assessment Improved  His updated medication list for this problem includes:    Chlorthalidone 25 Mg Tabs (Chlorthalidone) ..... One tablet by mouth once daily    Lotrel 10-40 Mg Caps (Amlodipine besy-benazepril hcl) ..... One tablet by mouth once daily  BP today: 118/62 Prior BP: 124/60 (12/25/2008)  Prior 10 Yr Risk Heart Disease: Not enough information (09/29/2008)  Problem # 2:  SKIN CANCER, HX OF (ICD-V10.83) Assessment: Unchanged  Orders: Dermatology Referral (Derma)  Problem # 3:  COPD (ICD-496) Assessment: Improved  His updated medication list for this problem includes:    Ventolin Hfa 108 (90 Base) Mcg/act Aers (Albuterol sulfate) .Marland Kitchen... 1-2 puffs qid as needed for wheezing    Advair Diskus 250-50 Mcg/dose Aepb (Fluticasone-salmeterol) .Marland Kitchen... 1 puff two times a day    Spiriva Handihaler 18 Mcg Caps (Tiotropium bromide monohydrate)  Orders: Tobacco use cessation intermediate 3-10 minutes (98119)  Complete Medication List: 1)  Simvastatin 80 Mg Tabs (  Simvastatin) .... One tablet by mouth once daily at bedtime 2)  Chlorthalidone 25 Mg Tabs (Chlorthalidone) .... One tablet by mouth once daily 3)  Lotrel 10-40 Mg Caps (Amlodipine besy-benazepril hcl) .... One tablet by mouth once daily 4)  Aspirin 81 Mg Tabs (Aspirin) .... One tablet by mouth once daily 5)  Metanx 3-35-2 Mg  .... Once daily 6)  Ventolin Hfa 108 (90 Base) Mcg/act Aers (Albuterol sulfate) .Marland Kitchen.. 1-2 puffs qid as needed for wheezing 7)  Advair Diskus 250-50 Mcg/dose Aepb (Fluticasone-salmeterol) .Marland Kitchen.. 1 puff two times a day 8)  Avelox 400 Mg Tabs (Moxifloxacin hcl) ....  Every 24hours 9)  Spiriva Handihaler 18 Mcg Caps (Tiotropium bromide monohydrate) 10)  Prednisone 10 Mg Tabs (Prednisone) .... Take 1 tablet by mouth once a day  Hypertension Assessment/Plan:      The patient's hypertensive risk group is category B: At least one risk factor (excluding diabetes) with no target organ damage.  Today's blood pressure is 118/62.  His blood pressure goal is < 140/90.  Lipid Assessment/Plan:      Based on NCEP/ATP III, the patient's risk factor category is "2 or more risk factors and a calculated 10 year CAD risk of > 20%".  The patient's lipid goals are as follows: Total cholesterol goal is 200; LDL cholesterol goal is 130; HDL cholesterol goal is 40; Triglyceride goal is 150.    Patient Instructions: 1)  Please schedule a follow-up appointment in 1-2 months for a complete physical. 2)  Tobacco is very bad for your health and your loved ones! You Should stop smoking!. 3)  Stop Smoking Tips: Choose a Quit date. Cut down before the Quit date. decide what you will do as a substitute when you feel the urge to smoke(gum,toothpick,exercise). 4)  Check your Blood Pressure regularly. If it is above 130/80: you should make an appointment.

## 2010-05-24 NOTE — Letter (Signed)
Summary: Pre Visit Letter Revised  [facility]  [address] [place], [place] [address]   Phone: [phone]  Fax: [phone]        01/20/2010 MRN: [id number] [name] [address] [place], [place]  [address]             Procedure Date:  03/07/2010   Welcome to the Gastroenterology Division at [facility].    You are scheduled to see a nurse for your pre-procedure visit on 04/25/2009 at 1:00PM on the 3rd floor at [facility], [address]  We ask that you try to arrive at our office 15 minutes prior to your appointment time to allow for check-in.  Please take a minute to review the attached form.  If you answer "Yes" to one or more of the questions on the first page, we ask that you call the person listed at your earliest opportunity.  If you answer "No" to all of the questions, please complete the rest of the form and bring it to your appointment.    Your nurse visit will consist of discussing your medical and surgical history, your immediate family medical history, and your medications.   If you are unable to list all of your medications on the form, please bring the medication bottles to your appointment and we will list them.  We will need to be aware of both prescribed and over the counter drugs.  We will need to know exact dosage information as well.    Please be prepared to read and sign documents such as consent forms, a financial agreement, and acknowledgement forms.  If necessary, and with your consent, a friend or relative is welcome to sit-in on the nurse visit with you.  Please bring your insurance card so that we may make a copy of it.  If your insurance requires a referral to see a specialist, please bring your referral form from your primary care physician.  No co-pay is required for this nurse visit.     If you cannot keep your appointment, please call [phone] to cancel or reschedule prior to your appointment date.  This  allows [place] the opportunity to schedule an appointment for another patient in need of care.    Thank you for choosing [facility] for your medical needs.  We appreciate the opportunity to care for you.  Please visit [place] at our website  to learn more about our practice.  Sincerely, The Gastroenterology Division

## 2010-05-24 NOTE — Progress Notes (Signed)
  Phone Note From Pharmacy   Summary of Call: Received  refill request for Neurpath-B tabs (Take 1 tablet by mouth once a day ). I did not see this on medication list. Please advise if OK.Marland KitchenMarland KitchenMarland KitchenAlvy Beal Archie CMA  September 27, 2009 4:56 PM  Initial call taken by: Rock Nephew CMA,  September 27, 2009 4:56 PM  Follow-up for Phone Call        YES, IT'S OK Follow-up by: Etta Grandchild MD,  September 28, 2009 7:00 AM  Additional Follow-up for Phone Call Additional follow up Details #1::        rx faxed to pharmacy.Marland KitchenMarland KitchenAlvy Beal Archie CMA  September 28, 2009 9:58 AM     New/Updated Medications: * NEUROPATH-B Take 1 tablet by mouth once a day Prescriptions: NEUROPATH-B Take 1 tablet by mouth once a day  #30 x 6   Entered by:   Rock Nephew CMA   Authorized by:   Etta Grandchild MD   Signed by:   Rock Nephew CMA on 09/28/2009   Method used:   Faxed to ...       [facility]* (retail)       [address] [place], [place]  [address]       Ph: [phone] or [phone]       Fax: [phone]   RxID:   [id number]

## 2010-05-24 NOTE — Assessment & Plan Note (Signed)
Summary: 2 MO ROV /NWS   Vital Signs:  Patient profile:   70 year old male Height:      73 inches Weight:      233 pounds BMI:     30.85 O2 Sat:      96 % on Room air Temp:     97.1 degrees F oral Pulse rate:   75 / minute Pulse rhythm:   regular Resp:     16 per minute BP sitting:   134 / 62  (left arm) Cuff size:   large  Vitals Entered By: Rock Nephew CMA (December 14, 2009 8:09 AM)  Nutrition Counseling: Patient's BMI is greater than 25 and therefore counseled on weight management options.  O2 Flow:  Room air CC: follow-up visit//63mo, Lipid Management Is Patient Diabetic? No Pain Assessment Patient in pain? no       Does patient need assistance? Functional Status Self care Ambulation Normal   Primary Care Provider:  Sanda Linger MD  CC:  follow-up visit//41mo and Lipid Management.  History of Present Illness:  Follow-Up Visit      This is a 70 year old man who presents for Follow-up visit.  The patient denies chest pain, palpitations, dizziness, syncope, edema, SOB, DOE, PND, and orthopnea.  Since the last visit the patient notes no new problems or concerns.  The patient reports taking meds as prescribed, monitoring BP, and dietary compliance.  When questioned about possible medication side effects, the patient notes none.    Lipid Management History:      Positive NCEP/ATP III risk factors include male age 11 years old or older, current tobacco user, and hypertension.  Negative NCEP/ATP III risk factors include non-diabetic, no family history for ischemic heart disease, no ASHD (atherosclerotic heart disease), no prior stroke/TIA, no peripheral vascular disease, and no history of aortic aneurysm.        The patient states that he knows about the "Therapeutic Lifestyle Change" diet.  His compliance with the TLC diet is good.  The patient expresses understanding of adjunctive measures for cholesterol lowering.  Adjunctive measures started by the patient include  aerobic exercise, fiber, limit alcohol consumpton, and weight reduction.  He expresses no side effects from his lipid-lowering medication.  The patient denies any symptoms to suggest myopathy or liver disease.     Preventive Screening-Counseling & Management  Alcohol-Tobacco     Alcohol drinks/day: 1     Alcohol type: spirits     >5/day in last 3 mos: no     Alcohol Counseling: not indicated; use of alcohol is not excessive or problematic     Feels need to cut down: no     Feels annoyed by complaints: no     Feels guilty re: drinking: no     Needs 'eye opener' in am: no     Smoking Status: current     Smoking Cessation Counseling: yes     Smoke Cessation Stage: precontemplative     Packs/Day: 1.0     Year Started: 1955     Pack years: 64     Tobacco Counseling: to quit use of tobacco products  Hep-HIV-STD-Contraception     Hepatitis Risk: no risk noted     HIV Risk: no risk noted     STD Risk: no risk noted      Sexual History:  currently monogamous.        Drug Use:  no.        Blood Transfusions:  no.    Clinical Review Panels:  Prevention   Last Colonoscopy:  Location:  [facility].  (12/02/2008)   Last PSA:  1.19 (06/09/2009)  Immunizations   Last Tetanus Booster:  Tdap (11/07/2006)   Last Flu Vaccine:  Fluvax 3+ (02/01/2009)   Last Pneumovax:  given (04/25/2007)  Lipid Management   Cholesterol:  130 (06/09/2009)   LDL (bad choesterol):  50 (06/09/2009)   HDL (good cholesterol):  58.50 (06/09/2009)  Diabetes Management   Creatinine:  1.3 (10/11/2009)   Last Flu Vaccine:  Fluvax 3+ (02/01/2009)   Last Pneumovax:  given (04/25/2007)  CBC   WBC:  8.3 (10/11/2009)   RBC:  4.33 (10/11/2009)   Hgb:  14.3 (10/11/2009)   Hct:  41.3 (10/11/2009)   Platelets:  240.0 (10/11/2009)   MCV  95.5 (10/11/2009)   MCHC  34.5 (10/11/2009)   RDW  13.2 (10/11/2009)   PMN:  71.9 (10/11/2009)   Lymphs:  18.4 (10/11/2009)   Monos:  7.8 (10/11/2009)    Eosinophils:  1.7 (10/11/2009)   Basophil:  0.2 (10/11/2009)  Complete Metabolic Panel   Glucose:  90 (10/11/2009)   Sodium:  142 (10/11/2009)   Potassium:  4.4 (10/11/2009)   Chloride:  105 (10/11/2009)   CO2:  30 (10/11/2009)   BUN:  40 (10/11/2009)   Creatinine:  1.3 (10/11/2009)   Albumin:  4.5 (10/11/2009)   Total Protein:  6.8 (10/11/2009)   Calcium:  9.7 (10/11/2009)   Total Bili:  0.8 (10/11/2009)   Alk Phos:  33 (10/11/2009)   SGPT (ALT):  36 (10/11/2009)   SGOT (AST):  28 (10/11/2009)   Medications Prior to Update: 1)  Chlorthalidone 25 Mg Tabs (Chlorthalidone) .... One Tablet By Mouth Once Daily 2)  Aspirin 81 Mg  Tabs (Aspirin) .... One Tablet By Mouth Once Daily 3)  Metanx 3-35-2 Mg .... Once Daily 4)  Ventolin Hfa 108 (90 Base) Mcg/act Aers (Albuterol Sulfate) .Marland Kitchen.. 1-2 Puffs Qid As Needed For Wheezing 5)  Advair Diskus 250-50 Mcg/dose Aepb (Fluticasone-Salmeterol) .Marland Kitchen.. 1 Puff Two Times A Day 6)  Spiriva Handihaler 18 Mcg Caps (Tiotropium Bromide Monohydrate) .... Inhale By Mouth 1 Capsule Via Handihaler Once Daily 7)  Mytussin Ac 100-10 Mg/65ml Syrp (Guaifenesin-Codeine) .... 5-10 Ml By Mouth Qid As Needed For Cough 8)  Neuropath-B .... Take 1 Tablet By Mouth Once A Day 9)  Crestor 20 Mg Tabs (Rosuvastatin Calcium) .... One By Mouth Once Daily  Current Medications (verified): 1)  Chlorthalidone 25 Mg Tabs (Chlorthalidone) .... One Tablet By Mouth Once Daily 2)  Aspirin 81 Mg  Tabs (Aspirin) .... One Tablet By Mouth Once Daily 3)  Metanx 3-35-2 Mg .... Once Daily 4)  Ventolin Hfa 108 (90 Base) Mcg/act Aers (Albuterol Sulfate) .Marland Kitchen.. 1-2 Puffs Qid As Needed For Wheezing 5)  Spiriva Handihaler 18 Mcg Caps (Tiotropium Bromide Monohydrate) .... Inhale By Mouth 1 Capsule Via Handihaler Once Daily 6)  Neuropath-B .... Take 1 Tablet By Mouth Once A Day 7)  Crestor 20 Mg Tabs (Rosuvastatin Calcium) .... One By Mouth Once Daily  Allergies (verified): No Known Drug  Allergies  Comments:  Nurse/Medical Assistant: The patient's medications and allergies were reviewed with the patient and were updated in the Medication and Allergy Lists. Rock Nephew CMA (December 14, 2009 8:09 AM)  Past History:  Past Medical History: Last updated: 09/29/2008 Skin cancer, hx of: BCC on nose COPD Hyperlipidemia Hypertension Osteoarthritis Genital warts 2009  Past Surgical History: Last updated: 09/29/2008 Inguinal herniorrhaphy Sinus surgery  Tonsillectomy Wart removal with grafts from scrotum Patsi Sears) in 2009  Family History: Last updated: 09/29/2008 adopted  Social History: Last updated: 10/27/2008 Retired Married 2 boys Current Smoker Alcohol use-yes: 3-4 every other day  Drug use-no Regular exercise-no Patient does not get regular exercise.  Daily Caffeine Use: 5 cups daily   Risk Factors: Alcohol Use: 1 (12/14/2009) >5 drinks/d w/in last 3 months: no (12/14/2009) Exercise: no (10/11/2009)  Risk Factors: Smoking Status: current (12/14/2009) Packs/Day: 1.0 (12/14/2009)  Family History: Reviewed history from 09/29/2008 and no changes required. adopted  Social History: Reviewed history from 10/27/2008 and no changes required. Retired Married 2 boys Current Smoker Alcohol use-yes: 3-4 every other day  Drug use-no Regular exercise-no Patient does not get regular exercise.  Daily Caffeine Use: 5 cups daily   Review of Systems       The patient complains of weight gain.  The patient denies anorexia, fever, weight loss, chest pain, syncope, dyspnea on exertion, peripheral edema, prolonged cough, headaches, hemoptysis, abdominal pain, hematuria, transient blindness, difficulty walking, depression, abnormal bleeding, enlarged lymph nodes, and angioedema.    Physical Exam  General:  Well developed, well nourished, no acute distress.healthy appearing.   Head:  Normocephalic and atraumatic. Eyes:  No corneal or conjunctival  inflammation noted. EOMI. Perrla. Funduscopic exam benign, without hemorrhages, exudates or papilledema. Vision grossly normal. Mouth:  Oral mucosa and oropharynx without lesions or exudates.  Teeth in good repair. Neck:  Supple; no masses or thyromegaly.no thyroid nodules or tenderness, no JVD, normal carotid upstroke, no carotid bruits, and no cervical lymphadenopathy.   Lungs:  normal respiratory effort, no intercostal retractions, no accessory muscle use, normal breath sounds, no dullness, no fremitus, no crackles, and no wheezes.   Heart:  normal rate, regular rhythm, no murmur, no gallop, no rub, and no JVD.   Abdomen:  soft, non-tender, normal bowel sounds, no distention, no masses, no guarding, no rigidity, no rebound tenderness, no abdominal hernia, no inguinal hernia, no hepatomegaly, and no splenomegaly.   Msk:  No deformity or scoliosis noted of thoracic or lumbar spine.   Pulses:  R and L carotid,radial,femoral,dorsalis pedis and posterior tibial pulses are full and equal bilaterally Extremities:  No clubbing, cyanosis, edema, or deformity noted with normal full range of motion of all joints.   Neurologic:  No cranial nerve deficits noted. Station and gait are normal. Plantar reflexes are down-going bilaterally. DTRs are symmetrical throughout. Sensory, motor and coordinative functions appear intact. Skin:  Intact without suspicious lesions or rashes Cervical Nodes:  no anterior cervical adenopathy and no posterior cervical adenopathy.   Axillary Nodes:  no R axillary adenopathy and no L axillary adenopathy.   Psych:  Cognition and judgment appear intact. Alert and cooperative with normal attention span and concentration. No apparent delusions, illusions, hallucinations   Impression & Recommendations:  Problem # 1:  TOBACCO USE (ICD-305.1) Assessment Unchanged  Encouraged smoking cessation and discussed different methods for smoking cessation.   Orders: Tobacco use cessation  intermediate 3-10 minutes (16109)  Problem # 2:  HYPERTENSION (ICD-401.9) Assessment: Improved  His updated medication list for this problem includes:    Chlorthalidone 25 Mg Tabs (Chlorthalidone) ..... One tablet by mouth once daily  BP today: 134/62 Prior BP: 98/52 (10/11/2009)  10 Yr Risk Heart Disease: 18 % Prior 10 Yr Risk Heart Disease: 14 % (10/11/2009)  Labs Reviewed: K+: 4.4 (10/11/2009) Creat: : 1.3 (10/11/2009)   Chol: 130 (06/09/2009)   HDL: 58.50 (06/09/2009)   LDL: 50 (  06/09/2009)   TG: 106.0 (06/09/2009)  Problem # 3:  HYPERLIPIDEMIA (ICD-272.4) Assessment: Improved  His updated medication list for this problem includes:    Crestor 20 Mg Tabs (Rosuvastatin calcium) ..... One by mouth once daily  Labs Reviewed: SGOT: 28 (10/11/2009)   SGPT: 36 (10/11/2009)  Lipid Goals: Chol Goal: 200 (09/29/2008)   HDL Goal: 40 (09/29/2008)   LDL Goal: 130 (09/29/2008)   TG Goal: 150 (09/29/2008)  10 Yr Risk Heart Disease: 18 % Prior 10 Yr Risk Heart Disease: 14 % (10/11/2009)   HDL:58.50 (06/09/2009)  LDL:50 (06/09/2009)  Chol:130 (06/09/2009)  Trig:106.0 (06/09/2009)  Problem # 4:  COPD (ICD-496) Assessment: Unchanged  The following medications were removed from the medication list:    Advair Diskus 250-50 Mcg/dose Aepb (Fluticasone-salmeterol) .Marland Kitchen... 1 puff two times a day His updated medication list for this problem includes:    Ventolin Hfa 108 (90 Base) Mcg/act Aers (Albuterol sulfate) .Marland Kitchen... 1-2 puffs qid as needed for wheezing    Spiriva Handihaler 18 Mcg Caps (Tiotropium bromide monohydrate) ..... Inhale by mouth 1 capsule via handihaler once daily  Orders: Tobacco use cessation intermediate 3-10 minutes (16109)  Complete Medication List: 1)  Chlorthalidone 25 Mg Tabs (Chlorthalidone) .... One tablet by mouth once daily 2)  Aspirin 81 Mg Tabs (Aspirin) .... One tablet by mouth once daily 3)  Metanx 3-35-2 Mg  .... Once daily 4)  Ventolin Hfa 108 (90 Base)  Mcg/act Aers (Albuterol sulfate) .Marland Kitchen.. 1-2 puffs qid as needed for wheezing 5)  Spiriva Handihaler 18 Mcg Caps (Tiotropium bromide monohydrate) .... Inhale by mouth 1 capsule via handihaler once daily 6)  Neuropath-b  .... Take 1 tablet by mouth once a day 7)  Crestor 20 Mg Tabs (Rosuvastatin calcium) .... One by mouth once daily  Lipid Assessment/Plan:      Based on NCEP/ATP III, the patient's risk factor category is "2 or more risk factors and a calculated 10 year CAD risk of < 20%".  The patient's lipid goals are as follows: Total cholesterol goal is 200; LDL cholesterol goal is 130; HDL cholesterol goal is 40; Triglyceride goal is 150.    Patient Instructions: 1)  Please schedule a follow-up appointment in 6 months. 2)  Tobacco is very bad for your health and your loved ones! You Should stop smoking!. 3)  Stop Smoking Tips: Choose a Quit date. Cut down before the Quit date. decide what you will do as a substitute when you feel the urge to smoke(gum,toothpick,exercise). 4)  It is important that you exercise regularly at least 20 minutes 5 times a week. If you develop chest pain, have severe difficulty breathing, or feel very tired , stop exercising immediately and seek medical attention. 5)  You need to lose weight. Consider a lower calorie diet and regular exercise.  6)  Schedule a colonoscopy/sigmoidoscopy to help detect colon cancer. 7)  Check your Blood Pressure regularly. If it is above 130/80: you should make an appointment.

## 2010-05-24 NOTE — Progress Notes (Signed)
Summary: prep ?'s  Phone Note Call from Patient Call back at Home Phone [phone]   Caller: Burna Mortimer, wife Call For: Dr. Jarold Motto Reason for Call: Talk to Nurse Summary of Call: prep ?'s Initial call taken by: Vallarie Mare,  March 02, 2010 2:54 PM  Follow-up for Phone Call        Spoke with pt's wife regarding diet questions. Advised to call back with any further questions. Follow-up by: Durwin Glaze RN,  March 02, 2010 4:10 PM

## 2010-05-24 NOTE — Progress Notes (Signed)
Summary: OV TODAY  Phone Note Call from Patient   Summary of Call: Pt c/o chest congestion & some SOB, req antibiotic. I advised office visit - scheduled for apt at 10:30 Initial call taken by: Lamar Sprinkles, CMA,  March 29, 2010 10:10 AM

## 2010-05-24 NOTE — Assessment & Plan Note (Signed)
Summary: per pt 2 wk fu  stc   Vital Signs:  Patient profile:   70 year old male Height:      73 inches Weight:      232 pounds BMI:     30.72 O2 Sat:      96 % on Room air Temp:     97.8 degrees F oral Pulse rate:   86 / minute Pulse rhythm:   regular Resp:     16 per minute BP sitting:   98 / 52  (left arm) Cuff size:   large  Vitals Entered By: Rock Nephew CMA (October 11, 2009 1:15 PM)  Nutrition Counseling: Patient's BMI is greater than 25 and therefore counseled on weight management options.  O2 Flow:  Room air  Primary Care Provider:  Sanda Linger MD   History of Present Illness:  Follow-Up Visit      This is a 70 year old man who presents for Follow-up visit.  The patient denies chest pain, palpitations, dizziness, syncope, edema, SOB, DOE, PND, and orthopnea.  Since the last visit the patient notes problems with medications.  The patient reports taking meds as prescribed, monitoring BP, and dietary compliance.  When questioned about possible medication side effects, the patient notes cramping.    Lipid Management History:      Positive NCEP/ATP III risk factors include male age 71 years old or older, current tobacco user, and hypertension.  Negative NCEP/ATP III risk factors include non-diabetic, no family history for ischemic heart disease, no ASHD (atherosclerotic heart disease), no prior stroke/TIA, no peripheral vascular disease, and no history of aortic aneurysm.        The patient states that he knows about the "Therapeutic Lifestyle Change" diet.  His compliance with the TLC diet is good.  The patient expresses understanding of adjunctive measures for cholesterol lowering.  Adjunctive measures started by the patient include aerobic exercise, fiber, limit alcohol consumpton, and weight reduction.  He notes side effects from his lipid-lowering medication.  The patient notes symptoms to suggest myopathy or liver disease.     Preventive Screening-Counseling &  Management  Alcohol-Tobacco     Alcohol drinks/day: 1     Alcohol type: spirits     >5/day in last 3 mos: no     Alcohol Counseling: not indicated; use of alcohol is not excessive or problematic     Feels need to cut down: no     Feels annoyed by complaints: no     Feels guilty re: drinking: no     Needs 'eye opener' in am: no     Smoking Status: current     Smoking Cessation Counseling: yes     Smoke Cessation Stage: precontemplative     Packs/Day: 1.0     Year Started: 1955     Pack years: 4  Caffeine-Diet-Exercise     Does Patient Exercise: no  Hep-HIV-STD-Contraception     Hepatitis Risk: no risk noted     HIV Risk: no risk noted     STD Risk: no risk noted      Sexual History:  currently monogamous.        Drug Use:  no.        Blood Transfusions:  no.    Medications Prior to Update: 1)  Simvastatin 80 Mg Tabs (Simvastatin) .... One Tablet By Mouth Once Daily At Bedtime 2)  Chlorthalidone 25 Mg Tabs (Chlorthalidone) .... One Tablet By Mouth Once Daily 3)  Lotrel 10-40  Mg Caps (Amlodipine Besy-Benazepril Hcl) .... One Tablet By Mouth Once Daily 4)  Aspirin 81 Mg  Tabs (Aspirin) .... One Tablet By Mouth Once Daily 5)  Metanx 3-35-2 Mg .... Once Daily 6)  Ventolin Hfa 108 (90 Base) Mcg/act Aers (Albuterol Sulfate) .Marland Kitchen.. 1-2 Puffs Qid As Needed For Wheezing 7)  Advair Diskus 250-50 Mcg/dose Aepb (Fluticasone-Salmeterol) .Marland Kitchen.. 1 Puff Two Times A Day 8)  Spiriva Handihaler 18 Mcg Caps (Tiotropium Bromide Monohydrate) .... Inhale By Mouth 1 Capsule Via Handihaler Once Daily 9)  Mytussin Ac 100-10 Mg/51ml Syrp (Guaifenesin-Codeine) .... 5-10 Ml By Mouth Qid As Needed For Cough 10)  Neuropath-B .... Take 1 Tablet By Mouth Once A Day  Current Medications (verified): 1)  Chlorthalidone 25 Mg Tabs (Chlorthalidone) .... One Tablet By Mouth Once Daily 2)  Aspirin 81 Mg  Tabs (Aspirin) .... One Tablet By Mouth Once Daily 3)  Metanx 3-35-2 Mg .... Once Daily 4)  Ventolin Hfa 108  (90 Base) Mcg/act Aers (Albuterol Sulfate) .Marland Kitchen.. 1-2 Puffs Qid As Needed For Wheezing 5)  Advair Diskus 250-50 Mcg/dose Aepb (Fluticasone-Salmeterol) .Marland Kitchen.. 1 Puff Two Times A Day 6)  Spiriva Handihaler 18 Mcg Caps (Tiotropium Bromide Monohydrate) .... Inhale By Mouth 1 Capsule Via Handihaler Once Daily 7)  Mytussin Ac 100-10 Mg/20ml Syrp (Guaifenesin-Codeine) .... 5-10 Ml By Mouth Qid As Needed For Cough 8)  Neuropath-B .... Take 1 Tablet By Mouth Once A Day 9)  Crestor 20 Mg Tabs (Rosuvastatin Calcium) .... One By Mouth Once Daily  Allergies (verified): No Known Drug Allergies  Past History:  Past Medical History: Last updated: 09/29/2008 Skin cancer, hx of: BCC on nose COPD Hyperlipidemia Hypertension Osteoarthritis Genital warts 2009  Past Surgical History: Last updated: 09/29/2008 Inguinal herniorrhaphy Sinus surgery Tonsillectomy Wart removal with grafts from scrotum Patsi Sears) in 2009  Family History: Last updated: 09/29/2008 adopted  Social History: Last updated: 10/27/2008 Retired Married 2 boys Current Smoker Alcohol use-yes: 3-4 every other day  Drug use-no Regular exercise-no Patient does not get regular exercise.  Daily Caffeine Use: 5 cups daily   Risk Factors: Alcohol Use: 1 (10/11/2009) >5 drinks/d w/in last 3 months: no (10/11/2009) Exercise: no (10/11/2009)  Risk Factors: Smoking Status: current (10/11/2009) Packs/Day: 1.0 (10/11/2009)  Family History: Reviewed history from 09/29/2008 and no changes required. adopted  Social History: Reviewed history from 10/27/2008 and no changes required. Retired Married 2 boys Current Smoker Alcohol use-yes: 3-4 every other day  Drug use-no Regular exercise-no Patient does not get regular exercise.  Daily Caffeine Use: 5 cups daily  Hepatitis Risk:  no risk noted HIV Risk:  no risk noted STD Risk:  no risk noted  Review of Systems  The patient denies anorexia, fever, weight loss, weight  gain, chest pain, syncope, dyspnea on exertion, peripheral edema, prolonged cough, headaches, hemoptysis, abdominal pain, suspicious skin lesions, and enlarged lymph nodes.    Physical Exam  General:  Well developed, well nourished, no acute distress.healthy appearing.   Head:  Normocephalic and atraumatic. Mouth:  Oral mucosa and oropharynx without lesions or exudates.  Teeth in good repair. Neck:  Supple; no masses or thyromegaly.no thyroid nodules or tenderness, no JVD, normal carotid upstroke, no carotid bruits, and no cervical lymphadenopathy.   Lungs:  normal respiratory effort, no intercostal retractions, no accessory muscle use, and normal breath sounds.  He has diffuse, bilateral exp and inspir wheezes with good air movement. Heart:  normal rate, regular rhythm, no murmur, no gallop, no rub, and no JVD.  Abdomen:  Bowel sounds positive,abdomen soft and non-tender without masses, organomegaly or hernias noted. Msk:  No deformity or scoliosis noted of thoracic or lumbar spine.   Pulses:  R and L carotid,radial,femoral,dorsalis pedis and posterior tibial pulses are full and equal bilaterally Extremities:  No clubbing, cyanosis, edema, or deformity noted with normal full range of motion of all joints.   Neurologic:  No cranial nerve deficits noted. Station and gait are normal. Plantar reflexes are down-going bilaterally. DTRs are symmetrical throughout. Sensory, motor and coordinative functions appear intact. Skin:  Intact without suspicious lesions or rashes Cervical Nodes:  no anterior cervical adenopathy and no posterior cervical adenopathy.   Psych:  Cognition and judgment appear intact. Alert and cooperative with normal attention span and concentration. No apparent delusions, illusions, hallucinations   Impression & Recommendations:  Problem # 1:  BRONCHITIS-ACUTE (ICD-466.0) Assessment Improved  His updated medication list for this problem includes:    Ventolin Hfa 108 (90 Base)  Mcg/act Aers (Albuterol sulfate) .Marland Kitchen... 1-2 puffs qid as needed for wheezing    Advair Diskus 250-50 Mcg/dose Aepb (Fluticasone-salmeterol) .Marland Kitchen... 1 puff two times a day    Spiriva Handihaler 18 Mcg Caps (Tiotropium bromide monohydrate) ..... Inhale by mouth 1 capsule via handihaler once daily    Mytussin Ac 100-10 Mg/71ml Syrp (Guaifenesin-codeine) .Marland Kitchen... 5-10 ml by mouth qid as needed for cough  Problem # 2:  HYPERTENSION (ICD-401.9) Assessment: Deteriorated  The following medications were removed from the medication list:    Lotrel 10-40 Mg Caps (Amlodipine besy-benazepril hcl) ..... One tablet by mouth once daily His updated medication list for this problem includes:    Chlorthalidone 25 Mg Tabs (Chlorthalidone) ..... One tablet by mouth once daily  Orders: Venipuncture (60454) TLB-BMP (Basic Metabolic Panel-BMET) (80048-METABOL) TLB-CBC Platelet - w/Differential (85025-CBCD) TLB-Hepatic/Liver Function Pnl (80076-HEPATIC) TLB-TSH (Thyroid Stimulating Hormone) (84443-TSH) TLB-CK Total Only(Creatine Kinase/CPK) (82550-CK)  BP today: 98/52 Prior BP: 112/54 (09/24/2009)  10 Yr Risk Heart Disease: 14 % Prior 10 Yr Risk Heart Disease: Not enough information (09/29/2008)  Labs Reviewed: K+: 4.0 (06/09/2009) Creat: : 1.1 (06/09/2009)   Chol: 130 (06/09/2009)   HDL: 58.50 (06/09/2009)   LDL: 50 (06/09/2009)   TG: 106.0 (06/09/2009)  Problem # 3:  HYPERLIPIDEMIA (ICD-272.4) Assessment: Deteriorated  The following medications were removed from the medication list:    Simvastatin 80 Mg Tabs (Simvastatin) ..... One tablet by mouth once daily at bedtime His updated medication list for this problem includes:    Crestor 20 Mg Tabs (Rosuvastatin calcium) ..... One by mouth once daily  Orders: Venipuncture (09811) TLB-BMP (Basic Metabolic Panel-BMET) (80048-METABOL) TLB-CBC Platelet - w/Differential (85025-CBCD) TLB-Hepatic/Liver Function Pnl (80076-HEPATIC) TLB-TSH (Thyroid Stimulating  Hormone) (84443-TSH) TLB-CK Total Only(Creatine Kinase/CPK) (82550-CK)  Labs Reviewed: SGOT: 27 (06/09/2009)   SGPT: 31 (06/09/2009)  Lipid Goals: Chol Goal: 200 (09/29/2008)   HDL Goal: 40 (09/29/2008)   LDL Goal: 130 (09/29/2008)   TG Goal: 150 (09/29/2008)  10 Yr Risk Heart Disease: 14 % Prior 10 Yr Risk Heart Disease: Not enough information (09/29/2008)   HDL:58.50 (06/09/2009)  LDL:50 (06/09/2009)  Chol:130 (06/09/2009)  Trig:106.0 (06/09/2009)  Complete Medication List: 1)  Chlorthalidone 25 Mg Tabs (Chlorthalidone) .... One tablet by mouth once daily 2)  Aspirin 81 Mg Tabs (Aspirin) .... One tablet by mouth once daily 3)  Metanx 3-35-2 Mg  .... Once daily 4)  Ventolin Hfa 108 (90 Base) Mcg/act Aers (Albuterol sulfate) .Marland Kitchen.. 1-2 puffs qid as needed for wheezing 5)  Advair Diskus 250-50 Mcg/dose Aepb (  Fluticasone-salmeterol) .Marland Kitchen.. 1 puff two times a day 6)  Spiriva Handihaler 18 Mcg Caps (Tiotropium bromide monohydrate) .... Inhale by mouth 1 capsule via handihaler once daily 7)  Mytussin Ac 100-10 Mg/44ml Syrp (Guaifenesin-codeine) .... 5-10 ml by mouth qid as needed for cough 8)  Neuropath-b  .... Take 1 tablet by mouth once a day 9)  Crestor 20 Mg Tabs (Rosuvastatin calcium) .... One by mouth once daily  Lipid Assessment/Plan:      Based on NCEP/ATP III, the patient's risk factor category is "2 or more risk factors and a calculated 10 year CAD risk of < 20%".  The patient's lipid goals are as follows: Total cholesterol goal is 200; LDL cholesterol goal is 130; HDL cholesterol goal is 40; Triglyceride goal is 150.    Patient Instructions: 1)  Please schedule a follow-up appointment in 2 months. 2)  Tobacco is very bad for your health and your loved ones! You Should stop smoking!. 3)  Stop Smoking Tips: Choose a Quit date. Cut down before the Quit date. decide what you will do as a substitute when you feel the urge to smoke(gum,toothpick,exercise). 4)  Check your Blood Pressure  regularly. If it is above 130/80: you should make an appointment. Prescriptions: CRESTOR 20 MG TABS (ROSUVASTATIN CALCIUM) One by mouth once daily  #126 x 0   Entered and Authorized by:   Etta Grandchild MD   Signed by:   Etta Grandchild MD on 10/11/2009   Method used:   Samples Given   RxID:   [id number]

## 2010-05-24 NOTE — Progress Notes (Signed)
Summary: refill  Phone Note Refill Request Message from:  Fax from Pharmacy on November 23, 2009 1:25 PM  Refills Requested: Medication #1:  CHLORTHALIDONE 25 MG TABS one tablet by mouth once daily   Dosage confirmed as above?Dosage Confirmed   Last Refilled: 10/18/2009 Initial call taken by: Rock Nephew CMA,  November 23, 2009 1:25 PM    Prescriptions: CHLORTHALIDONE 25 MG TABS (CHLORTHALIDONE) one tablet by mouth once daily  #30 x 6   Entered by:   Rock Nephew CMA   Authorized by:   Etta Grandchild MD   Signed by:   Rock Nephew CMA on 11/23/2009   Method used:   Electronically to        [facility]* (retail)       [address] [place], [place]  [address]       Ph: [phone] or [phone]       Fax: [phone]   RxID:   [id number]

## 2010-05-24 NOTE — Letter (Signed)
Summary: Lipid Letter  [facility]  [address] [place], [place] [address]   Phone: [phone]  Fax: [phone]    06/10/2009  [name] [address] [place], [place]  [address]  Dear Jonny Ruiz:  We have carefully reviewed your last lipid profile from 06/09/2009 and the results are noted below with a summary of recommendations for lipid management.    Cholesterol:       130     Goal: <200   HDL "good" Cholesterol:   23.53     Goal: >40   LDL "bad" Cholesterol:   50     Goal: <130   Triglycerides:       106.0     Goal: <150    these results are excellent    TLC Diet (Therapeutic Lifestyle Change): Saturated Fats & Transfatty acids should be kept < 7% of total calories ***Reduce Saturated Fats Polyunstaurated Fat can be up to 10% of total calories Monounsaturated Fat Fat can be up to 20% of total calories Total Fat should be no greater than 25-35% of total calories Carbohydrates should be 50-60% of total calories Protein should be approximately 15% of total calories Fiber should be at least 20-30 grams a day ***Increased fiber may help lower LDL Total Cholesterol should be < 200mg /day Consider adding plant stanol/sterols to diet (example: Benacol spread) ***A higher intake of unsaturated fat may reduce Triglycerides and Increase HDL    Adjunctive Measures (may lower LIPIDS and reduce risk of Heart Attack) include: Aerobic Exercise (20-30 minutes 3-4 times a week) Limit Alcohol Consumption Weight Reduction Aspirin 75-81 mg a day by mouth (if not allergic or contraindicated) Dietary Fiber 20-30 grams a day by mouth     Current Medications: 1)    Simvastatin 80 Mg Tabs (Simvastatin) .... One tablet by mouth once daily at bedtime 2)    Chlorthalidone 25 Mg Tabs (Chlorthalidone) .... One tablet by mouth once daily 3)    Lotrel 10-40 Mg Caps (Amlodipine besy-benazepril hcl) .... One tablet by mouth once daily 4)    Aspirin 81 Mg  Tabs (Aspirin) .... One  tablet by mouth once daily 5)    Metanx 3-35-2 Mg  .... Once daily 6)    Ventolin Hfa 108 (90 Base) Mcg/act Aers (Albuterol sulfate) .Marland Kitchen.. 1-2 puffs qid as needed for wheezing 7)    Advair Diskus 250-50 Mcg/dose Aepb (Fluticasone-salmeterol) .Marland Kitchen.. 1 puff two times a day 8)    Spiriva Handihaler 18 Mcg Caps (Tiotropium bromide monohydrate)  If you have any questions, please call. We appreciate being able to work with you.   Sincerely,    [facility] Etta Grandchild MD

## 2010-05-24 NOTE — Assessment & Plan Note (Signed)
Summary: cough/cxr first / SD   Vital Signs:  Patient profile:   70 year old male Height:      73 inches Weight:      238 pounds BMI:     31.51 O2 Sat:      92 % on Room air Temp:     97.5 degrees F oral Pulse rate:   87 / minute Pulse rhythm:   regular Resp:     20 per minute BP sitting:   112 / 54  (left arm) Cuff size:   large  Vitals Entered By: Rock Nephew CMA (September 24, 2009 2:00 PM)  Nutrition Counseling: Patient's BMI is greater than 25 and therefore counseled on weight management options.  O2 Flow:  Room air CC: congestion, URI symptoms   Primary Care Provider:  Sanda Linger MD  CC:  congestion and URI symptoms.  History of Present Illness:  URI Symptoms      This is a 70 year old man who presents with URI symptoms.  The symptoms began 1 week ago.  The severity is described as moderate.  The patient reports nasal congestion, sore throat, and productive cough, but denies clear nasal discharge, purulent nasal discharge, dry cough, earache, and sick contacts.  Associated symptoms include dyspnea and wheezing.  The patient denies fever, stiff neck, rash, vomiting, diarrhea, use of an antipyretic, and response to antipyretic.  The patient denies sneezing, seasonal symptoms, headache, muscle aches, and severe fatigue.  The patient denies the following risk factors for Strep sinusitis: unilateral facial pain, unilateral nasal discharge, double sickening, Strep exposure, tender adenopathy, and absence of cough.    Preventive Screening-Counseling & Management  Alcohol-Tobacco     Alcohol drinks/day: 1     Alcohol type: spirits     >5/day in last 3 mos: no     Alcohol Counseling: not indicated; use of alcohol is not excessive or problematic     Feels need to cut down: no     Feels annoyed by complaints: no     Feels guilty re: drinking: no     Needs 'eye opener' in am: no     Smoking Status: current     Smoking Cessation Counseling: yes     Smoke Cessation Stage:  precontemplative     Packs/Day: 1.0     Year Started: 1955     Pack years: 70  Allergies: No Known Drug Allergies  Past History:  Past Medical History: Reviewed history from 09/29/2008 and no changes required. Skin cancer, hx of: BCC on nose COPD Hyperlipidemia Hypertension Osteoarthritis Genital warts 2009  Past Surgical History: Reviewed history from 09/29/2008 and no changes required. Inguinal herniorrhaphy Sinus surgery Tonsillectomy Wart removal with grafts from scrotum Patsi Sears) in 2009  Family History: Reviewed history from 09/29/2008 and no changes required. adopted  Social History: Reviewed history from 10/27/2008 and no changes required. Retired Married 2 boys Current Smoker Alcohol use-yes: 3-4 every other day  Drug use-no Regular exercise-no Patient does not get regular exercise.  Daily Caffeine Use: 5 cups daily   Review of Systems       The patient complains of dyspnea on exertion and prolonged cough.  The patient denies anorexia, fever, weight loss, weight gain, decreased hearing, hoarseness, chest pain, syncope, peripheral edema, headaches, hemoptysis, abdominal pain, hematuria, suspicious skin lesions, abnormal bleeding, enlarged lymph nodes, angioedema, and breast masses.    Physical Exam  General:  Well developed, well nourished, no acute distress.healthy appearing.   Head:  Normocephalic and atraumatic. Eyes:  No corneal or conjunctival inflammation noted. EOMI. Perrla. Funduscopic exam benign, without hemorrhages, exudates or papilledema. Vision grossly normal. Mouth:  Oral mucosa and oropharynx without lesions or exudates.  Teeth in good repair. Neck:  Supple; no masses or thyromegaly.no thyroid nodules or tenderness, no JVD, normal carotid upstroke, no carotid bruits, and no cervical lymphadenopathy.   Lungs:  normal respiratory effort, no intercostal retractions, no accessory muscle use, and normal breath sounds.  He has diffuse,  bilateral exp and inspir wheezes with good air movement. Heart:  normal rate, regular rhythm, no murmur, no gallop, no rub, and no JVD.   Abdomen:  Bowel sounds positive,abdomen soft and non-tender without masses, organomegaly or hernias noted. Msk:  No deformity or scoliosis noted of thoracic or lumbar spine.   Pulses:  R and L carotid,radial,femoral,dorsalis pedis and posterior tibial pulses are full and equal bilaterally Extremities:  No clubbing, cyanosis, edema, or deformity noted with normal full range of motion of all joints.   Neurologic:  No cranial nerve deficits noted. Station and gait are normal. Plantar reflexes are down-going bilaterally. DTRs are symmetrical throughout. Sensory, motor and coordinative functions appear intact. Skin:  Intact without suspicious lesions or rashes Cervical Nodes:  no anterior cervical adenopathy and no posterior cervical adenopathy.   Axillary Nodes:  no R axillary adenopathy and no L axillary adenopathy.   Inguinal Nodes:  no R inguinal adenopathy and no L inguinal adenopathy.   Psych:  Cognition and judgment appear intact. Alert and cooperative with normal attention span and concentration. No apparent delusions, illusions, hallucinations Additional Exam:  He received an inhalation treatment with Xopenex and after the jet neb his lungs are clear bilaterally.   Impression & Recommendations:  Problem # 1:  CHRONIC OBSTRUCTIVE PULMONARY DISEASE, ACUTE EXACERBATION (ICD-491.21) Assessment New will have to stop Lotrel if the cough continues. try depo-medrol and xopenex  Orders: Nebulizer Tx ([address]) Tobacco use cessation intermediate 3-10 minutes (60454)  Problem # 2:  BRONCHITIS-ACUTE (ICD-466.0) Assessment: New  His updated medication list for this problem includes:    Ventolin Hfa 108 (90 Base) Mcg/act Aers (Albuterol sulfate) .Marland Kitchen... 1-2 puffs qid as needed for wheezing    Advair Diskus 250-50 Mcg/dose Aepb (Fluticasone-salmeterol) .Marland Kitchen... 1 puff  two times a day    Spiriva Handihaler 18 Mcg Caps (Tiotropium bromide monohydrate) ..... Inhale by mouth 1 capsule via handihaler once daily    Avelox 400 Mg Tabs (Moxifloxacin hcl) ..... One by mouth once daily for 5 days    Mytussin Ac 100-10 Mg/36ml Syrp (Guaifenesin-codeine) .Marland Kitchen... 5-10 ml by mouth qid as needed for cough  Orders: Tobacco use cessation intermediate 3-10 minutes (09811)  Problem # 3:  TOBACCO USE (ICD-305.1) Assessment: Unchanged  Encouraged smoking cessation and discussed different methods for smoking cessation.   Orders: Tobacco use cessation intermediate 3-10 minutes (91478)  Problem # 4:  HYPERTENSION (ICD-401.9) Assessment: Improved  His updated medication list for this problem includes:    Chlorthalidone 25 Mg Tabs (Chlorthalidone) ..... One tablet by mouth once daily    Lotrel 10-40 Mg Caps (Amlodipine besy-benazepril hcl) ..... One tablet by mouth once daily  BP today: 112/54 Prior BP: 114/58 (06/09/2009)  Prior 10 Yr Risk Heart Disease: Not enough information (09/29/2008)  Labs Reviewed: K+: 4.0 (06/09/2009) Creat: : 1.1 (06/09/2009)   Chol: 130 (06/09/2009)   HDL: 58.50 (06/09/2009)   LDL: 50 (06/09/2009)   TG: 106.0 (06/09/2009)  Complete Medication List: 1)  Simvastatin 80 Mg Tabs (  Simvastatin) .... One tablet by mouth once daily at bedtime 2)  Chlorthalidone 25 Mg Tabs (Chlorthalidone) .... One tablet by mouth once daily 3)  Lotrel 10-40 Mg Caps (Amlodipine besy-benazepril hcl) .... One tablet by mouth once daily 4)  Aspirin 81 Mg Tabs (Aspirin) .... One tablet by mouth once daily 5)  Metanx 3-35-2 Mg  .... Once daily 6)  Ventolin Hfa 108 (90 Base) Mcg/act Aers (Albuterol sulfate) .Marland Kitchen.. 1-2 puffs qid as needed for wheezing 7)  Advair Diskus 250-50 Mcg/dose Aepb (Fluticasone-salmeterol) .Marland Kitchen.. 1 puff two times a day 8)  Spiriva Handihaler 18 Mcg Caps (Tiotropium bromide monohydrate) .... Inhale by mouth 1 capsule via handihaler once daily 9)  Avelox  400 Mg Tabs (Moxifloxacin hcl) .... One by mouth once daily for 5 days 10)  Mytussin Ac 100-10 Mg/33ml Syrp (Guaifenesin-codeine) .... 5-10 ml by mouth qid as needed for cough  Patient Instructions: 1)  Please schedule a follow-up appointment in 2 weeks. 2)  Tobacco is very bad for your health and your loved ones! You Should stop smoking!. 3)  Stop Smoking Tips: Choose a Quit date. Cut down before the Quit date. decide what you will do as a substitute when you feel the urge to smoke(gum,toothpick,exercise). 4)  Take your antibiotic as prescribed until ALL of it is gone, but stop if you develop a rash or swelling and contact our office as soon as possible. 5)  Acute bronchitis symptoms for less than 10 days are not helped by antibiotics. take over the counter cough medications. call if no improvment in  5-7 days, sooner if increasing cough, fever, or new symptoms( shortness of breath, chest pain). Prescriptions: MYTUSSIN AC 100-10 MG/5ML SYRP (GUAIFENESIN-CODEINE) 5-10 ml by mouth QID as needed for cough  #8 ounces x 1   Entered and Authorized by:   Etta Grandchild MD   Signed by:   Etta Grandchild MD on 09/24/2009   Method used:   Print then Give to Patient   RxID:   [id number] AVELOX 400 MG TABS (MOXIFLOXACIN HCL) One by mouth once daily for 5 days  #5 x 0   Entered and Authorized by:   Etta Grandchild MD   Signed by:   Etta Grandchild MD on 09/24/2009   Method used:   Samples Given   RxID:   [id number]

## 2010-05-24 NOTE — Progress Notes (Signed)
  Phone Note Call from Patient   Summary of Call: Pt c/o cough & possible fever. CXR ordered, office visit today after  Initial call taken by: Lamar Sprinkles, CMA,  September 24, 2009 12:08 PM

## 2010-05-24 NOTE — Progress Notes (Signed)
  Phone Note Refill Request Message from:  Fax from Pharmacy on Sep 13, 2009 12:49 PM  Refills Requested: Medication #1:  SPIRIVA HANDIHALER 18 MCG CAPS.   Supply Requested: 1 month   Last Refilled: 08/27/39    New/Updated Medications: SPIRIVA HANDIHALER 18 MCG CAPS (TIOTROPIUM BROMIDE MONOHYDRATE) inhale by mouth 1 capsule via handihaler once daily Prescriptions: SPIRIVA HANDIHALER 18 MCG CAPS (TIOTROPIUM BROMIDE MONOHYDRATE) inhale by mouth 1 capsule via handihaler once daily  #30 x 11   Entered by:   Rock Nephew CMA   Authorized by:   Etta Grandchild MD   Signed by:   Rock Nephew CMA on 09/13/2009   Method used:   Electronically to        [facility]* (retail)       [address] [place], [place]  [address]       Ph: [phone] or [phone]       Fax: [phone]   RxID:   [id number]

## 2010-05-24 NOTE — Miscellaneous (Signed)
Summary: [facility] Previsit/prep  Clinical Lists Changes  Medications: Added new medication of MOVIPREP 100 GM  SOLR (PEG-KCL-NACL-NASULF-NA ASC-C) As per prep instructions. - Signed Rx of MOVIPREP 100 GM  SOLR (PEG-KCL-NACL-NASULF-NA ASC-C) As per prep instructions.;  #1 x 0;  Signed;  Entered by: Wyona Almas RN;  Authorized by: Mardella Layman MD [facility];  Method used: Electronically to [facility]*, [address] [place], [place]  [address], Ph: [phone] or [phone], Fax: [phone] Observations: Added new observation of NKA: T (02/23/2010 12:26)    Prescriptions: MOVIPREP 100 GM  SOLR (PEG-KCL-NACL-NASULF-NA ASC-C) As per prep instructions.  #1 x 0   Entered by:   Wyona Almas RN   Authorized by:   Mardella Layman MD [facility]   Signed by:   Wyona Almas RN on 02/23/2010   Method used:   Electronically to        [facility]* (retail)       [address] [place], [place]  [address]       Ph: [phone] or [phone]       Fax: [phone]   RxID:   [id number]

## 2010-05-26 NOTE — Progress Notes (Signed)
Summary: refill  Phone Note Refill Request Message from:  Fax from Pharmacy on May 05, 2010 10:02 AM  Refills Requested: Medication #1:  ADVAIR DISKUS 250-50 MCG/DOSE AEPB as directed.    New/Updated Medications: ADVAIR DISKUS 250-50 MCG/DOSE AEPB (FLUTICASONE-SALMETEROL) Inhale by mouth 1 puff 2x daily Prescriptions: ADVAIR DISKUS 250-50 MCG/DOSE AEPB (FLUTICASONE-SALMETEROL) Inhale by mouth 1 puff 2x daily  #67mo x 6   Entered by:   Rock Nephew CMA   Authorized by:   Etta Grandchild MD   Signed by:   Rock Nephew CMA on 05/05/2010   Method used:   Electronically to        [facility]* (retail)       [address] [place], [place]  [address]       Ph: [phone] or [phone]       Fax: [phone]   RxID:   [id number]

## 2010-05-26 NOTE — Progress Notes (Signed)
Summary: REQ FOR RX  Phone Note Call from Patient   Caller: [phone] - Wife Summary of Call: [place]. He is c/o fever, weakness, sinus pressure & pain. Patient is requesting rx from MD as they are unable to see MD where they are.  Initial call taken by: Lamar Sprinkles, CMA,  April 15, 2010 11:03 AM  Follow-up for Phone Call        Wife informed, rx called in  to [facility] at [phone] Follow-up by: Lamar Sprinkles, CMA,  April 15, 2010 12:11 PM    New/Updated Medications: CEFUROXIME AXETIL 500 MG TABS (CEFUROXIME AXETIL) One by mouth two times a day for 14 days Prescriptions: CEFUROXIME AXETIL 500 MG TABS (CEFUROXIME AXETIL) One by mouth two times a day for 14 days  #28 x 0   Entered and Authorized by:   Etta Grandchild MD   Signed by:   Etta Grandchild MD on 04/15/2010   Method used:   Historical   RxID:   [id number]

## 2010-06-29 ENCOUNTER — Other Ambulatory Visit: Payer: Self-pay | Admitting: Internal Medicine

## 2010-06-29 ENCOUNTER — Encounter: Payer: Self-pay | Admitting: Internal Medicine

## 2010-06-29 ENCOUNTER — Ambulatory Visit: Payer: Self-pay | Admitting: Internal Medicine

## 2010-06-29 ENCOUNTER — Ambulatory Visit (INDEPENDENT_AMBULATORY_CARE_PROVIDER_SITE_OTHER): Payer: Medicare Other | Admitting: Internal Medicine

## 2010-06-29 ENCOUNTER — Telehealth: Payer: Self-pay | Admitting: Internal Medicine

## 2010-06-29 ENCOUNTER — Other Ambulatory Visit: Payer: No Typology Code available for payment source

## 2010-06-29 DIAGNOSIS — N401 Enlarged prostate with lower urinary tract symptoms: Secondary | ICD-10-CM

## 2010-06-29 DIAGNOSIS — I10 Essential (primary) hypertension: Secondary | ICD-10-CM

## 2010-06-29 DIAGNOSIS — E785 Hyperlipidemia, unspecified: Secondary | ICD-10-CM

## 2010-06-29 DIAGNOSIS — J441 Chronic obstructive pulmonary disease with (acute) exacerbation: Secondary | ICD-10-CM

## 2010-06-29 DIAGNOSIS — Z0000 Encounter for general adult medical examination without abnormal findings: Secondary | ICD-10-CM

## 2010-06-29 LAB — LIPID PANEL
Cholesterol: 162 mg/dL (ref 0–200)
HDL: 68.7 mg/dL (ref >39.00)
LDL Cholesterol: 78 mg/dL (ref 0–99)
Total CHOL/HDL Ratio: 2
Triglycerides: 78 mg/dL (ref 0.0–149.0)
VLDL: 15.6 mg/dL (ref 0.0–40.0)

## 2010-06-29 LAB — HEPATIC FUNCTION PANEL
AST: 30 U/L (ref 0–37)
Albumin: 4.3 g/dL (ref 3.5–5.2)
Alkaline Phosphatase: 36 U/L — ABNORMAL LOW (ref 39–117)
Total Protein: 6.8 g/dL (ref 6.0–8.3)

## 2010-06-29 LAB — CBC WITH DIFFERENTIAL/PLATELET
Basophils Relative: 0.8 % (ref 0.0–3.0)
HCT: 47.8 % (ref 39.0–52.0)
Hemoglobin: 16.6 g/dL (ref 13.0–17.0)
Lymphocytes Relative: 21.2 % (ref 12.0–46.0)
Lymphs Abs: 1.2 10*3/uL (ref 0.7–4.0)
MCHC: 34.6 g/dL (ref 30.0–36.0)
Monocytes Relative: 8.8 % (ref 3.0–12.0)
Neutro Abs: 3.7 10*3/uL (ref 1.4–7.7)
RBC: 5.09 Mil/uL (ref 4.22–5.81)

## 2010-06-29 LAB — TSH: TSH: 1.31 u[IU]/mL (ref 0.35–5.50)

## 2010-06-29 LAB — BASIC METABOLIC PANEL
CO2: 30 mEq/L (ref 19–32)
Calcium: 9.8 mg/dL (ref 8.4–10.5)
GFR: 90.74 mL/min (ref >60.00)
Glucose, Bld: 89 mg/dL (ref 70–99)
Potassium: 3.7 mEq/L (ref 3.5–5.1)
Sodium: 142 mEq/L (ref 135–145)

## 2010-06-29 LAB — URINALYSIS, ROUTINE W REFLEX MICROSCOPIC
Bilirubin Urine: NEGATIVE
Hgb urine dipstick: NEGATIVE
Ketones, ur: NEGATIVE
Total Protein, Urine: NEGATIVE
Urine Glucose: NEGATIVE

## 2010-06-29 LAB — PSA: PSA: 1.61 ng/mL (ref 0.10–4.00)

## 2010-06-29 LAB — CONVERTED CEMR LAB

## 2010-07-05 NOTE — Assessment & Plan Note (Signed)
Summary: yearly fu/medicare/ to come fasting/ nws   Vital Signs:  Patient profile:   70 year old male Height:      73 inches Weight:      234 pounds BMI:     30.98 O2 Sat:      98 % on Room air Temp:     97.7 degrees F oral Pulse rate:   88 / minute Pulse rhythm:   regular Resp:     16 per minute BP sitting:   138 / 60  (left arm) Cuff size:   regular  Vitals Entered By: Lanier Prude, CMA(AAMA) (June 29, 2010 9:00 AM)  Nutrition Counseling: Patient's BMI is greater than 25 and therefore counseled on weight management options.  O2 Flow:  Room air CC: Yearly , Hypertension Management, Lipid Management, Preventive Care Is Patient Diabetic? No   Primary Care Provider:  Sanda Linger MD  CC:  Yearly , Hypertension Management, Lipid Management, and Preventive Care.  History of Present Illness: Here for Medicare AWV:  1.   Risk factors based on Past M, S, F history: yes 2.   Physical Activities: he is very active 3.   Depression/mood: his mood is very good 4.   Hearing: very good 5.   ADL's: thorough and complete 6.   Fall Risk: none 7.   Home Safety: very good 8.   Height, weight, &visual acuity: done 9.   Counseling: yes 10.   Labs ordered based on risk factors: done 11.           Referral Coordination: none at this time 12.           Care Plan: completed 13.            Cognitive Assessment : very good  Hypertension History:      He denies headache, chest pain, palpitations, dyspnea with exertion, orthopnea, PND, peripheral edema, visual symptoms, neurologic problems, syncope, and side effects from treatment.        Positive major cardiovascular risk factors include male age 69 years old or older, hyperlipidemia, hypertension, and current tobacco user.  Negative major cardiovascular risk factors include no history of diabetes and negative family history for ischemic heart disease.        Further assessment for target organ damage reveals no history of ASHD, cardiac  end-organ damage (CHF/LVH), stroke/TIA, peripheral vascular disease, renal insufficiency, or hypertensive retinopathy.    Lipid Management History:      Positive NCEP/ATP III risk factors include male age 67 years old or older, current tobacco user, and hypertension.  Negative NCEP/ATP III risk factors include non-diabetic, no family history for ischemic heart disease, no ASHD (atherosclerotic heart disease), no prior stroke/TIA, no peripheral vascular disease, and no history of aortic aneurysm.        The patient states that he knows about the "Therapeutic Lifestyle Change" diet.  His compliance with the TLC diet is good.  The patient expresses understanding of adjunctive measures for cholesterol lowering.  Adjunctive measures started by the patient include aerobic exercise, fiber, ASA, limit alcohol consumpton, and weight reduction.  He expresses no side effects from his lipid-lowering medication.  The patient denies any symptoms to suggest myopathy or liver disease.      Preventive Screening-Counseling & Management  Alcohol-Tobacco     Alcohol drinks/day: 0-2     Alcohol type: Scotch     Smoking Status: current  Caffeine-Diet-Exercise     Caffeine use/day: 10 cups  Does Patient Exercise: no  Hep-HIV-STD-Contraception     Dental Visit-last 6 months yes     TSE monthly: no     Sun Exposure-Excessive: no  Safety-Violence-Falls     Seat Belt Use: yes     Helmet Use: n/a     Firearms in the Home: no firearms in the home     Smoke Detectors: yes     Violence in the Home: no risk noted     Sexual Abuse: no     Fall Risk: none  Allergies: No Known Drug Allergies  Past History:  Past Medical History: Last updated: 09/29/2008 Skin cancer, hx of: BCC on nose COPD Hyperlipidemia Hypertension Osteoarthritis Genital warts 2009  Past Surgical History: Last updated: 09/29/2008 Inguinal herniorrhaphy Sinus surgery Tonsillectomy Wart removal with grafts from scrotum  Patsi Sears) in 2009  Family History: Last updated: 09/29/2008 adopted  Social History: Last updated: 10/27/2008 Retired Married 2 boys Current Smoker Alcohol use-yes: 3-4 every other day  Drug use-no Regular exercise-no Patient does not get regular exercise.  Daily Caffeine Use: 5 cups daily   Risk Factors: Alcohol Use: 0-2 (06/29/2010) >5 drinks/d w/in last 3 months: no (03/29/2010) Caffeine Use: 10 cups (06/29/2010) Exercise: no (06/29/2010)  Risk Factors: Smoking Status: current (06/29/2010) Packs/Day: 1.0 (03/29/2010)  Family History: Reviewed history from 09/29/2008 and no changes required. adopted  Social History: Reviewed history from 10/27/2008 and no changes required. Retired Married 2 boys Current Smoker Alcohol use-yes: 3-4 every other day  Drug use-no Regular exercise-no Patient does not get regular exercise.  Daily Caffeine Use: 5 cups daily  Caffeine use/day:  10 cups Dental Care w/in 6 mos.:  yes Sun Exposure-Excessive:  no Seat Belt Use:  yes Fall Risk:  none  Review of Systems  The patient denies anorexia, fever, weight loss, weight gain, chest pain, syncope, dyspnea on exertion, peripheral edema, prolonged cough, headaches, hemoptysis, abdominal pain, melena, hematochezia, severe indigestion/heartburn, hematuria, suspicious skin lesions, transient blindness, difficulty walking, depression, unusual weight change, abnormal bleeding, enlarged lymph nodes, angioedema, and testicular masses.   GU:  Denies decreased libido, discharge, dysuria, erectile dysfunction, genital sores, hematuria, incontinence, nocturia, urinary frequency, and urinary hesitancy.  Physical Exam  General:  Well developed, well nourished, no acute distress.healthy appearing.   Head:  Normocephalic and atraumatic. Eyes:  No corneal or conjunctival inflammation noted. EOMI. Perrla. Funduscopic exam benign, without hemorrhages, exudates or papilledema. Vision grossly  normal. Mouth:  Oral mucosa and oropharynx without lesions or exudates.  Teeth in good repair. Neck:  Supple; no masses or thyromegaly.no thyroid nodules or tenderness, no JVD, normal carotid upstroke, no carotid bruits, and no cervical lymphadenopathy.   Lungs:  normal respiratory effort, no intercostal retractions, no accessory muscle use, no dullness, no fremitus, no wheezes, and L base crackles.   Heart:  normal rate, regular rhythm, no murmur, no gallop, no rub, and no JVD.   Abdomen:  soft, non-tender, normal bowel sounds, no distention, no masses, no guarding, no rigidity, no rebound tenderness, no abdominal hernia, no inguinal hernia, no hepatomegaly, and no splenomegaly.   Rectal:  No external abnormalities noted. Normal sphincter tone. No rectal masses or tenderness. heme negative. Genitalia:  circumcised, no hydrocele, no varicocele, no scrotal masses, no testicular masses or atrophy, no cutaneous lesions, and no urethral discharge.   Prostate:  no nodules, no asymmetry, no induration, and 1+ enlarged.   Msk:  No deformity or scoliosis noted of thoracic or lumbar spine.   Pulses:  R and L  carotid,radial,femoral,dorsalis pedis and posterior tibial pulses are full and equal bilaterally Extremities:  No clubbing, cyanosis, edema, or deformity noted with normal full range of motion of all joints.   Neurologic:  No cranial nerve deficits noted. Station and gait are normal. Plantar reflexes are down-going bilaterally. DTRs are symmetrical throughout. Sensory, motor and coordinative functions appear intact. Skin:  Intact without suspicious lesions or rashes, seborrheic keratosis.   Cervical Nodes:  no anterior cervical adenopathy and no posterior cervical adenopathy.   Axillary Nodes:  no R axillary adenopathy and no L axillary adenopathy.   Inguinal Nodes:  no R inguinal adenopathy and no L inguinal adenopathy.   Psych:  Cognition and judgment appear intact. Alert and cooperative with normal  attention span and concentration. No apparent delusions, illusions, hallucinations   Impression & Recommendations:  Problem # 1:  ROUTINE GENERAL MEDICAL EXAM@HEALTH  CARE FACL (ICD-V70.0) Assessment New  Colonoscopy: DONE (03/07/2010) Td Booster: Tdap (11/07/2006)   Flu Vax: Fluvax 3+ (03/29/2010)   Pneumovax: given (04/25/2007) Chol: 130 (06/09/2009)   HDL: 58.50 (06/09/2009)   LDL: 50 (06/09/2009)   TG: 106.0 (06/09/2009) TSH: 1.16 (10/11/2009)   PSA: 1.19 (06/09/2009) Next Colonoscopy due:: 02/2013 (03/10/2010)  Discussed using sunscreen, use of alcohol, drug use, self testicular exam, routine dental care, routine eye care, routine physical exam, seat belts, multiple vitamins, osteoporosis prevention, adequate calcium intake in diet, and recommendations for immunizations.  Discussed exercise and checking cholesterol. Also recommend checking PSA.  Orders: [facility] -Subsequent Annual Wellness Visit [phone])  Problem # 2:  CHRONIC OBSTRUCTIVE PULMONARY DISEASE, ACUTE EXACERBATION (ICD-491.21) Assessment: Unchanged  Problem # 3:  HYPERTROPHY PROSTATE W/UR OBST & OTH LUTS (ICD-600.01) Assessment: Unchanged  Orders: Venipuncture (13086) TLB-Lipid Panel (80061-LIPID) TLB-BMP (Basic Metabolic Panel-BMET) (80048-METABOL) TLB-CBC Platelet - w/Differential (85025-CBCD) TLB-Hepatic/Liver Function Pnl (80076-HEPATIC) TLB-TSH (Thyroid Stimulating Hormone) (84443-TSH) TLB-PSA (Prostate Specific Antigen) (84153-PSA) TLB-Udip w/ Micro (81001-URINE)  PSA: 1.19 (06/09/2009)     Problem # 4:  HYPERTENSION (ICD-401.9) Assessment: Improved  His updated medication list for this problem includes:    Chlorthalidone 25 Mg Tabs (Chlorthalidone) ..... One tablet by mouth once daily  Orders: Venipuncture (57846) TLB-Lipid Panel (80061-LIPID) TLB-BMP (Basic Metabolic Panel-BMET) (80048-METABOL) TLB-CBC Platelet - w/Differential (85025-CBCD) TLB-Hepatic/Liver Function Pnl (80076-HEPATIC) TLB-TSH  (Thyroid Stimulating Hormone) (84443-TSH) TLB-PSA (Prostate Specific Antigen) (84153-PSA) TLB-Udip w/ Micro (81001-URINE)  BP today: 138/60 Prior BP: 120/62 (03/29/2010)  10 Yr Risk Heart Disease: 18 % Prior 10 Yr Risk Heart Disease: 14 % (03/29/2010)  Labs Reviewed: K+: 4.4 (10/11/2009) Creat: : 1.3 (10/11/2009)   Chol: 130 (06/09/2009)   HDL: 58.50 (06/09/2009)   LDL: 50 (06/09/2009)   TG: 106.0 (06/09/2009)  Problem # 5:  HYPERLIPIDEMIA (ICD-272.4) Assessment: Unchanged  His updated medication list for this problem includes:    Crestor 20 Mg Tabs (Rosuvastatin calcium) ..... One by mouth once daily  Orders: Venipuncture (96295) TLB-Lipid Panel (80061-LIPID) TLB-BMP (Basic Metabolic Panel-BMET) (80048-METABOL) TLB-CBC Platelet - w/Differential (85025-CBCD) TLB-Hepatic/Liver Function Pnl (80076-HEPATIC) TLB-TSH (Thyroid Stimulating Hormone) (84443-TSH) TLB-PSA (Prostate Specific Antigen) (84153-PSA) TLB-Udip w/ Micro (81001-URINE)  Complete Medication List: 1)  Chlorthalidone 25 Mg Tabs (Chlorthalidone) .... One tablet by mouth once daily 2)  Aspirin 81 Mg Tabs (Aspirin) .... One tablet by mouth once daily 3)  Metanx 3-35-2 Mg  .... Once daily 4)  Ventolin Hfa 108 (90 Base) Mcg/act Aers (Albuterol sulfate) .Marland Kitchen.. 1-2 puffs qid as needed for wheezing 5)  Spiriva Handihaler 18 Mcg Caps (Tiotropium bromide monohydrate) .... Inhale by mouth 1 capsule via handihaler  once daily 6)  Neuropath-b  .... Take 1 tablet by mouth once a day 7)  Crestor 20 Mg Tabs (Rosuvastatin calcium) .... One by mouth once daily 8)  Advair Diskus 250-50 Mcg/dose Aepb (Fluticasone-salmeterol) .... Inhale by mouth 1 puff 2x daily  Hypertension Assessment/Plan:      The patient's hypertensive risk group is category B: At least one risk factor (excluding diabetes) with no target organ damage.  His calculated 10 year risk of coronary heart disease is 18 %.  Today's blood pressure is 138/60.  His blood pressure  goal is < 140/90.  Lipid Assessment/Plan:      Based on NCEP/ATP III, the patient's risk factor category is "2 or more risk factors and a calculated 10 year CAD risk of < 20%".  The patient's lipid goals are as follows: Total cholesterol goal is 200; LDL cholesterol goal is 130; HDL cholesterol goal is 40; Triglyceride goal is 150.    Colorectal Screening:  Current Recommendations:    Hemoccult: NEG X 1 today    Colonoscopy recommended: patient defers today but will consider in the future  PSA Screening:    PSA: 1.19  (06/09/2009)    Reviewed PSA screening recommendations: PSA ordered  Immunization & Chemoprophylaxis:    Varicella vaccine: Varicella  (11/05/2007)    Tetanus vaccine: Tdap  (11/07/2006)    Influenza vaccine: Fluvax 3+  (03/29/2010)    Pneumovax: given  (04/25/2007)  Patient Instructions: 1)  Please schedule a follow-up appointment in 3 months. 2)  It is important that you exercise regularly at least 20 minutes 5 times a week. If you develop chest pain, have severe difficulty breathing, or feel very tired , stop exercising immediately and seek medical attention. 3)  You need to lose weight. Consider a lower calorie diet and regular exercise.  4)  Check your Blood Pressure regularly. If it is above 130/80: you should make an appointment.   Orders Added: 1)  Venipuncture [36415] 2)  TLB-Lipid Panel [80061-LIPID] 3)  TLB-BMP (Basic Metabolic Panel-BMET) [80048-METABOL] 4)  TLB-CBC Platelet - w/Differential [85025-CBCD] 5)  TLB-Hepatic/Liver Function Pnl [80076-HEPATIC] 6)  TLB-TSH (Thyroid Stimulating Hormone) [84443-TSH] 7)  TLB-PSA (Prostate Specific Antigen) [84153-PSA] 8)  TLB-Udip w/ Micro [81001-URINE] 9)  [facility] -Subsequent Annual Wellness Visit [G0439] 10)  Est. Patient Level III [09811]

## 2010-07-05 NOTE — Progress Notes (Signed)
Summary: refill  Phone Note Refill Request Message from:  Fax from Pharmacy on June 29, 2010 2:58 PM  Refills Requested: Medication #1:  METANX 3-35-2 MG once daily Initial call taken by: Rock Nephew CMA,  June 29, 2010 2:59 PM    Prescriptions: METANX 3-35-2 MG once daily  #90 x 3   Entered by:   Rock Nephew CMA   Authorized by:   Etta Grandchild MD   Signed by:   Rock Nephew CMA on 06/29/2010   Method used:   Faxed to ...       [facility]* (retail)       [address] [place], [place]  [address]       Ph: [phone] or [phone]       Fax: [phone]   RxID:   [id number]

## 2010-07-05 NOTE — Letter (Signed)
Summary: Lipid Letter  [facility]  [address] [place], [place] [address]   Phone: [phone]  Fax: [phone]    06/29/2010  [name] [address] [place], [place]  [address]  Dear [name]:  We have carefully reviewed your last lipid profile from 06/29/2010 and the results are noted below with a summary of recommendations for lipid management.    Cholesterol:       162     Goal: <200   HDL "good" Cholesterol:   57.84     Goal: >40   LDL "bad" Cholesterol:   78     Goal: <130   Triglycerides:       78.0     Goal: <150        TLC Diet (Therapeutic Lifestyle Change): Saturated Fats & Transfatty acids should be kept < 7% of total calories ***Reduce Saturated Fats Polyunstaurated Fat can be up to 10% of total calories Monounsaturated Fat Fat can be up to 20% of total calories Total Fat should be no greater than 25-35% of total calories Carbohydrates should be 50-60% of total calories Protein should be approximately 15% of total calories Fiber should be at least 20-30 grams a day ***Increased fiber may help lower LDL Total Cholesterol should be < 200mg /day Consider adding plant stanol/sterols to diet (example: Benacol spread) ***A higher intake of unsaturated fat may reduce Triglycerides and Increase HDL    Adjunctive Measures (may lower LIPIDS and reduce risk of Heart Attack) include: Aerobic Exercise (20-30 minutes 3-4 times a week) Limit Alcohol Consumption Weight Reduction Aspirin 75-81 mg a day by mouth (if not allergic or contraindicated) Dietary Fiber 20-30 grams a day by mouth     Current Medications: 1)    Chlorthalidone 25 Mg Tabs (Chlorthalidone) .... One tablet by mouth once daily 2)    Aspirin 81 Mg  Tabs (Aspirin) .... One tablet by mouth once daily 3)    Metanx 3-35-2 Mg  .... Once daily 4)    Ventolin Hfa 108 (90 Base) Mcg/act Aers (Albuterol sulfate) .Marland Kitchen.. 1-2 puffs qid as needed for wheezing 5)    Spiriva Handihaler 18 Mcg Caps  (Tiotropium bromide monohydrate) .... Inhale by mouth 1 capsule via handihaler once daily 6)    Neuropath-b  .... Take 1 tablet by mouth once a day 7)    Crestor 20 Mg Tabs (Rosuvastatin calcium) .... One by mouth once daily 8)    Advair Diskus 250-50 Mcg/dose Aepb (Fluticasone-salmeterol) .... Inhale by mouth 1 puff 2x daily  If you have any questions, please call. We appreciate being able to work with you.   Sincerely,    [facility] Etta Grandchild MD

## 2010-07-15 ENCOUNTER — Other Ambulatory Visit: Payer: Self-pay | Admitting: Internal Medicine

## 2010-07-15 ENCOUNTER — Telehealth: Payer: Self-pay | Admitting: Internal Medicine

## 2010-07-15 MED ORDER — CHLORTHALIDONE 25 MG PO TABS: 25.0000 mg | ORAL_TABLET | Freq: Every day | ORAL | Status: DC

## 2010-07-15 NOTE — Telephone Encounter (Signed)
Chlorthalidone 25mg  Tab #30x6

## 2010-07-25 LAB — LEGIONELLA ANTIGEN, URINE: Legionella Antigen, Urine: NEGATIVE

## 2010-07-25 LAB — BLOOD GAS, VENOUS
Bicarbonate: 26.5 mEq/L — ABNORMAL HIGH (ref 20.0–24.0)
TCO2: 23.3 mmol/L (ref 0–100)
pCO2, Ven: 40.9 mmHg — ABNORMAL LOW (ref 45.0–50.0)
pH, Ven: 7.427 — ABNORMAL HIGH (ref 7.250–7.300)

## 2010-07-25 LAB — DIFFERENTIAL
Basophils Absolute: 0 10*3/uL (ref 0.0–0.1)
Basophils Relative: 0 % (ref 0–1)
Eosinophils Relative: 4 % (ref 0–5)
Monocytes Absolute: 0.3 10*3/uL (ref 0.1–1.0)
Neutro Abs: 6 10*3/uL (ref 1.7–7.7)

## 2010-07-25 LAB — CBC
HCT: 36.3 % — ABNORMAL LOW (ref 39.0–52.0)
HCT: 37.2 % — ABNORMAL LOW (ref 39.0–52.0)
HCT: 37.5 % — ABNORMAL LOW (ref 39.0–52.0)
Hemoglobin: 13.2 g/dL (ref 13.0–17.0)
MCHC: 34.2 g/dL (ref 30.0–36.0)
MCV: 94.4 fL (ref 78.0–100.0)
MCV: 94.6 fL (ref 78.0–100.0)
Platelets: 166 10*3/uL (ref 150–400)
Platelets: 178 10*3/uL (ref 150–400)
RBC: 3.83 MIL/uL — ABNORMAL LOW (ref 4.22–5.81)
RBC: 3.94 MIL/uL — ABNORMAL LOW (ref 4.22–5.81)
RBC: 4.01 MIL/uL — ABNORMAL LOW (ref 4.22–5.81)
WBC: 10.6 10*3/uL — ABNORMAL HIGH (ref 4.0–10.5)
WBC: 11.9 10*3/uL — ABNORMAL HIGH (ref 4.0–10.5)
WBC: 7.7 10*3/uL (ref 4.0–10.5)

## 2010-07-25 LAB — COMPREHENSIVE METABOLIC PANEL
AST: 23 U/L (ref 0–37)
BUN: 14 mg/dL (ref 6–23)
CO2: 27 mEq/L (ref 19–32)
Chloride: 99 mEq/L (ref 96–112)
Creatinine, Ser: 1.07 mg/dL (ref 0.4–1.5)
GFR calc non Af Amer: >60 mL/min (ref >60)
Glucose, Bld: 136 mg/dL — ABNORMAL HIGH (ref 70–99)
Total Bilirubin: 0.8 mg/dL (ref 0.3–1.2)

## 2010-07-25 LAB — LIPID PANEL: HDL: 57 mg/dL (ref >39)

## 2010-07-25 LAB — BASIC METABOLIC PANEL
CO2: 26 mEq/L (ref 19–32)
CO2: 28 mEq/L (ref 19–32)
Calcium: 8.6 mg/dL (ref 8.4–10.5)
Calcium: 8.8 mg/dL (ref 8.4–10.5)
Calcium: 9 mg/dL (ref 8.4–10.5)
Chloride: 101 mEq/L (ref 96–112)
Chloride: 103 mEq/L (ref 96–112)
Creatinine, Ser: 1.14 mg/dL (ref 0.4–1.5)
Glucose, Bld: 158 mg/dL — ABNORMAL HIGH (ref 70–99)
Sodium: 133 mEq/L — ABNORMAL LOW (ref 135–145)
Sodium: 138 mEq/L (ref 135–145)

## 2010-07-25 LAB — STREP PNEUMONIAE URINARY ANTIGEN

## 2010-09-06 NOTE — Op Note (Signed)
NAME:  [name], [name] NO.:  [id number]   MEDICAL RECORD NO.:  [id number]           PATIENT TYPE:   LOCATION:                                 FACILITY:   PHYSICIAN:  Sigmund I. Patsi Sears, M.D.DATE OF BIRTH:  07-Nov-1940   DATE OF PROCEDURE:  03/28/2007  DATE OF DISCHARGE:                               OPERATIVE REPORT   PREOPERATIVE DIAGNOSES:  Multiple giant penile condylomata.   POSTOPERATIVE DIAGNOSES:  Multiple giant penile condylomata.   OPERATION:  YAG laser vaporization of giant penile condylomata.   SURGEON:  Sigmund I. Patsi Sears, M.D.   ANESTHESIA:  General LMA.   PREPARATION:  After appropriate pre-anesthesia, the patient was brought  to the operating room, placed on the operating table in the dorsal  supine position, where general LMA anesthesia was introduced.  He was  then replaced in dorsal lithotomy position, where the pubis was prepped  with Betadine solution and draped in the usual fashion.   REVIEW OF HISTORY:  This 70 year old male has a history of massive giant  penile condylomata, which have been unresponsive to treatment with  Condylox in the past.  He is now for YAG laser vaporization of warts.   DESCRIPTION OF PROCEDURE:  With the patient in the supine position, the  penis was prepped with Betadine solution and draped in the usual  fashion.  Wet towels were placed around the penis.  YAG laser  vaporization of warts was accomplished, and removal of warts for DNA  analysis was also accomplished.  Following YAG laser vaporization of  warts, there was no bleeding, and a sterile dressing was applied.  The  patient was awakened and taken to the recovery room in good condition.      Sigmund I. Patsi Sears, M.D.  Electronically Signed     SIT/MEDQ  D:  03/28/2007  T:  03/28/2007  Job:  [id number]

## 2010-09-06 NOTE — Op Note (Signed)
**Note Dan-Identified via Obfuscation** NAME:  [name], [name] NO.:  [id number]   MEDICAL RECORD NO.:  [id number]          PATIENT TYPE:  AMB   LOCATION:  [facility]                         FACILITY:  [facility]   PHYSICIAN:  [name], M.D.DATE OF BIRTH:  July 16, 1940   DATE OF PROCEDURE:  09/23/2007  DATE OF DISCHARGE:                               OPERATIVE REPORT   PREOPERATIVE DIAGNOSIS:  Penile condylomata.   POSTOPERATIVE DIAGNOSIS:  Penile condylomata.   PROCEDURE PERFORMED:  1. Excision of penile condylomata.  2. Complex tissue rearrangement.   SURGEON:  Jethro Bolus, M.D.   ASSISTANT:  Melina Schools.   ANESTHESIA:  General.   INDICATIONS FOR PROCEDURE:  [name] is a 70 year old male with a  history of recurrent penile condylomata.  These had been lasered and  also undergone excisional biopsy.  He has HSV serotype 6.  Because of  the recurrent nature and potential for malignant transformation, wide  excision with possible complex closure is indicated.   DESCRIPTION OF PROCEDURE:  The patient was brought to the operating  room.  He was identified by his arm band.  Informed consent was verified  and preoperative time-out was performed.  Perioperative antibiotics were  administered.  The operative site was shaved, prepped and draped in  usual fashion.  Foley catheter was inserted into the bladder and the  bladder was drained.  We then identified the area of dissected skin.  It  was an ovoid region that was approximately 6 x 4 cm extending from the  dorsal aspect of the penile shaft wrapping around the left lateral side  crossing the midline to the right.  It did not extend down onto the  scrotum nor to the corona of the glans.  This was outlined with a  marking pen and the skin was sharply incised to circumscribe the entire  area that was effected.  Blunt and sharp dissection was used to carry  this down to the level of Buck's fascia on the penile shaft.  We then  dissected it off  of the penile shaft using great care to keep from  violating the hemiscrotum.  We then used Bovie cautery to ensure  excellent hemostasis.  Once we had removed all the affected skin, we  began planning skin coverage.  The defect was primarily on the left  lateral aspect of the penile shaft.  We performed two relaxing incisions  for the mobilization of skin grafts .  It was an approximately 4 cm  incision on the scrotum medium raphe and a second 3 cm incision in the  inguinal crease.  We then mobilized skin flaps based upon those relaxing  incisions.  Great care was taken to preserve the vascularity of our  flaps.  We then advanced the scrotal flap onto the base of the penile  shaft and advanced the inguinal based flap onto the dorsal aspect of the  penis.  Having done this, we then used horizontal mattress in an  interrupted fashion to close the inguinal site.  We then advanced penile  skin down to meet our  scrotal flap.  All the skin incisions were closed  with Vicryl after using chromic as a tacking suture.  Prior to complete  closure we brought a drain through a counter incision into the dependent  portion the patient's scrotum.  We then reapproximated all the skin with  simple interrupted Vicryls.  This achieved excellent skin coverage.  We  had good vascular bases for our two flaps.  At this time the procedure  was terminated.  The Foley catheter was removed and the operative site  was dressed with scrotal support.  The patient tolerated the procedure  well and there were no complications.  Dan Patsi Arellano was the  attending primary responsible physician and was present and participated  in all aspects of the procedure.   DISPOSITION:  The patient was awoken uneventfully from general  anesthetic and will be admitted for a period of observation.     ______________________________  Melina Schools      [name], M.D.  Electronically Signed    JR/MEDQ  D:   09/23/2007  T:  09/23/2007  Job:  [id number]

## 2010-09-09 NOTE — Op Note (Signed)
NAME:  [name], [name] NO.:  [id number]   MEDICAL RECORD NO.:  [id number]          PATIENT TYPE:  AMB   LOCATION:  SDS                          FACILITY:  [facility]   PHYSICIAN:  Robert L. Dione Booze, M.D.  DATE OF BIRTH:  1940-10-17   DATE OF PROCEDURE:  02/16/2007  DATE OF DISCHARGE:  02/14/2007                               OPERATIVE REPORT   INDICATIONS FOR PROCEDURE:  [name] was recently seen in my office on  February 01, 2007 and is followed regularly by Dr. Leslee Home.  He was  seen for a complete examination and because he has a large amount of  redundant skin of each of his upper eyelids, which bothers him  enormously, particularly bothering his upper visual fields, and causing  some fatigue and various other symptoms.  When seen in my office for the  examination, the vision was correctable to 20/20 in each eye.  He is 70  years old.  Pressures were 18 the superior visual field was enormously  reduced the pupils, motility, conjunctivae, cornea, slit lamp  examination, anterior chamber and fundus examination with dilatation  were negative.  Externally, he does have an enormous amount of redundant  skin that actually covers his eyelashes and interferes with the superior  field of vision.  He can lift the skin of each upper lid and see better  and does feel enormous fatigue.  Photographs were taken to show this and  the visual field was tested with the skin taped compared to when it was  not taped and there is approximately a 40-degree loss of the superior  field, or even more than that.  The problem was discussed and he decided  to have upper blepharoplasties in order to see better.  Medically, he  should be stable.   Justification for performing procedure in outpatient setting is routine.  Justification for not having this study is none.   PREOPERATIVE DIAGNOSIS:  Severe dermatochalasis with visual impairment.   POSTOPERATIVE DIAGNOSIS:  Severe dermatochalasis  with visual impairment.   OPERATION PERFORMED:  Upper eyelid blepharoplasty.   SURGEON:  Robert L. Dione Booze, M.D.   ANESTHESIA:  1% Xylocaine with epinephrine.   PROCEDURE:  The patient arrived in the minor surgery room at [facility] and was prepped and draped in the routine fashion.  1%  Xylocaine with epinephrine was given to the skin of each upper eyelid  and the skin to be excised was carefully demarcated and excised along  with some underlying subcutaneous and fatty tissue.  Bleeding was  controlled with cautery and pressure.  Each wound was closed with a  running 6-0 nylon suture and cold compresses were applied along with  some Polysporin ointment.  The patient then left the operating room  having done nicely.   FOLLOWUP CARE:  The patient will be seen in my office in 5 or 6 days to  have these sutures removed.  He is to keep his head elevated today.  He  is to use cold compresses today and then will begin warm compresses,  starting the next day.           ______________________________  Doris Cheadle. Dione Booze, M.D.     RLG/MEDQ  D:  02/16/2007  T:  02/17/2007  Job:  [id number]   cc:   Reuben Likes, M.D.

## 2010-09-29 ENCOUNTER — Encounter: Payer: Self-pay | Admitting: Internal Medicine

## 2010-09-29 ENCOUNTER — Other Ambulatory Visit (INDEPENDENT_AMBULATORY_CARE_PROVIDER_SITE_OTHER): Payer: Medicare Other

## 2010-09-29 ENCOUNTER — Ambulatory Visit (INDEPENDENT_AMBULATORY_CARE_PROVIDER_SITE_OTHER): Payer: Medicare Other | Admitting: Internal Medicine

## 2010-09-29 VITALS — BP 138/64 | HR 77 | Temp 98.0°F | Resp 16 | Wt 235.0 lb

## 2010-09-29 DIAGNOSIS — I10 Essential (primary) hypertension: Secondary | ICD-10-CM

## 2010-09-29 DIAGNOSIS — N529 Male erectile dysfunction, unspecified: Secondary | ICD-10-CM

## 2010-09-29 LAB — BASIC METABOLIC PANEL
BUN: 18 mg/dL (ref 6–23)
Chloride: 97 mEq/L (ref 96–112)
Creatinine, Ser: 1.1 mg/dL (ref 0.4–1.5)
GFR: 73.15 mL/min (ref >60.00)
Potassium: 3.4 mEq/L — ABNORMAL LOW (ref 3.5–5.1)

## 2010-09-29 MED ORDER — SILDENAFIL CITRATE 100 MG PO TABS: 100.0000 mg | ORAL_TABLET | ORAL | Status: DC | PRN

## 2010-09-29 NOTE — Patient Instructions (Signed)

## 2010-09-29 NOTE — Progress Notes (Signed)
Subjective:    Patient ID: [name], male    DOB: [birth year], 70 y.o.   MRN: [id number]  Erectile Dysfunction This is a recurrent problem. The current episode started more than 1 year ago. The problem has been waxing and waning since onset. The nature of his difficulty is achieving erection, maintaining erection and penetration. Non-physiologic factors contributing to erectile dysfunction are performance anxiety. He reports no anxiety. He reports his erection duration to be 1 to 5 minutes. Irritative symptoms do not include frequency, nocturia or urgency. Obstructive symptoms do not include dribbling, incomplete emptying, an intermittent stream, a slower stream, straining or a weak stream. Pertinent negatives include no chills, dysuria, genital pain, hematuria, hesitancy or inability to urinate. Past treatments include sildenafil. The treatment provided significant relief. He has been using treatment for 2 or more years. He has had no adverse reactions caused by medications. Risk factors include hypertension and tobacco use.  Hypertension This is a chronic problem. The current episode started more than 1 month ago. The problem has been gradually improving since onset. The problem is controlled. Pertinent negatives include no anxiety, blurred vision, chest pain, headaches, malaise/fatigue, neck pain, orthopnea, palpitations, peripheral edema, PND, shortness of breath or sweats. There are no associated agents to hypertension. Past treatments include diuretics. The current treatment provides significant improvement. Compliance problems include exercise.       Review of Systems  Constitutional: Negative for fever, chills, malaise/fatigue, diaphoresis, activity change, appetite change, fatigue and unexpected weight change.  HENT: Negative for facial swelling, trouble swallowing, neck pain, neck stiffness and voice change.   Eyes: Negative for blurred vision, photophobia and visual disturbance.    Respiratory: Negative for apnea, cough, choking, chest tightness, shortness of breath, wheezing and stridor.   Cardiovascular: Negative for chest pain, palpitations, orthopnea, leg swelling and PND.  Gastrointestinal: Negative for nausea, vomiting, abdominal pain, diarrhea, constipation and anal bleeding.  Genitourinary: Negative for dysuria, hesitancy, urgency, frequency, hematuria, flank pain, decreased urine volume, discharge, penile swelling, scrotal swelling, enuresis, difficulty urinating, genital sores, penile pain, testicular pain, incomplete emptying and nocturia.  Musculoskeletal: Negative for myalgias, back pain, joint swelling, arthralgias and gait problem.  Skin: Negative for color change, pallor and rash.  Neurological: Negative for dizziness, tremors, seizures, syncope, facial asymmetry, speech difficulty, weakness, light-headedness, numbness and headaches.  Hematological: Negative for adenopathy. Does not bruise/bleed easily.  Psychiatric/Behavioral: Negative.        Objective:   Physical Exam  [vitalsreviewed. Constitutional: He is oriented to person, place, and time. He appears well-developed and well-nourished. No distress.  HENT:  Head: Normocephalic and atraumatic.  Right Ear: External ear normal.  Left Ear: External ear normal.  Nose: Nose normal.  Mouth/Throat: Oropharynx is clear and moist. No oropharyngeal exudate.  Eyes: Conjunctivae and EOM are normal. Pupils are equal, round, and reactive to light. Right eye exhibits no discharge. Left eye exhibits no discharge. No scleral icterus.  Neck: Normal range of motion. Neck supple. No JVD present. No tracheal deviation present. No thyromegaly present.  Cardiovascular: Normal rate, regular rhythm, normal heart sounds and intact distal pulses.  Exam reveals no gallop and no friction rub.   No murmur heard. Pulmonary/Chest: Effort normal and breath sounds normal. No stridor. No respiratory distress. He has no wheezes. He  has no rales. He exhibits no tenderness.  Abdominal: Soft. Bowel sounds are normal. He exhibits no distension and no mass. There is no tenderness. There is no rebound and no guarding.  Genitourinary: Rectum normal,  prostate normal and penis normal. Guaiac negative stool. No penile tenderness.  Musculoskeletal: Normal range of motion. He exhibits edema (trace edema in both legs). He exhibits no tenderness.  Lymphadenopathy:    He has no cervical adenopathy.  Neurological: He is alert and oriented to person, place, and time. He has normal reflexes. He displays normal reflexes. No cranial nerve deficit. He exhibits normal muscle tone. Coordination normal.  Skin: Skin is warm and dry. No rash noted. He is not diaphoretic. No erythema. No pallor.  Psychiatric: He has a normal mood and affect. His behavior is normal. Judgment and thought content normal.        Lab Results  Component Value Date   WBC 5.6 06/29/2010   HGB 16.6 06/29/2010   HCT 47.8 06/29/2010   PLT 155.0 06/29/2010   CHOL 162 06/29/2010   TRIG 78.0 06/29/2010   HDL 68.70 06/29/2010   ALT 29 06/29/2010   AST 30 06/29/2010   NA 142 06/29/2010   K 3.7 06/29/2010   CL 101 06/29/2010   CREATININE 0.9 06/29/2010   BUN 18 06/29/2010   CO2 30 06/29/2010   TSH 1.31 06/29/2010   PSA 1.61 06/29/2010    Assessment & Plan:

## 2010-09-29 NOTE — Assessment & Plan Note (Signed)
BP is well controlled, I will monitor his lytes and renal function today 

## 2010-09-29 NOTE — Assessment & Plan Note (Signed)
Check testosterone level and try viagra

## 2010-09-30 ENCOUNTER — Encounter: Payer: Self-pay | Admitting: Internal Medicine

## 2010-09-30 LAB — TESTOSTERONE, FREE, TOTAL, SHBG
Sex Hormone Binding: 42 nmol/L (ref 13–71)
Testosterone, Free: 53.2 pg/mL (ref 47.0–244.0)
Testosterone-% Free: 1.7 % (ref 1.6–2.9)

## 2010-10-25 ENCOUNTER — Other Ambulatory Visit: Payer: Self-pay | Admitting: Internal Medicine

## 2010-12-19 ENCOUNTER — Encounter: Payer: Self-pay | Admitting: Internal Medicine

## 2010-12-19 ENCOUNTER — Telehealth: Payer: Self-pay | Admitting: *Deleted

## 2010-12-19 ENCOUNTER — Other Ambulatory Visit (INDEPENDENT_AMBULATORY_CARE_PROVIDER_SITE_OTHER): Payer: Medicare Other

## 2010-12-19 ENCOUNTER — Ambulatory Visit (INDEPENDENT_AMBULATORY_CARE_PROVIDER_SITE_OTHER): Payer: Medicare Other | Admitting: Internal Medicine

## 2010-12-19 ENCOUNTER — Ambulatory Visit (INDEPENDENT_AMBULATORY_CARE_PROVIDER_SITE_OTHER)
Admission: RE | Admit: 2010-12-19 | Discharge: 2010-12-19 | Disposition: A | Discharge status: Home / Self Care | Payer: Medicare Other | Source: Ambulatory Visit | Attending: Internal Medicine | Admitting: Internal Medicine

## 2010-12-19 DIAGNOSIS — N529 Male erectile dysfunction, unspecified: Secondary | ICD-10-CM

## 2010-12-19 DIAGNOSIS — R10814 Left lower quadrant abdominal tenderness: Secondary | ICD-10-CM

## 2010-12-19 DIAGNOSIS — E785 Hyperlipidemia, unspecified: Secondary | ICD-10-CM

## 2010-12-19 DIAGNOSIS — I10 Essential (primary) hypertension: Secondary | ICD-10-CM

## 2010-12-19 DIAGNOSIS — R5381 Other malaise: Secondary | ICD-10-CM

## 2010-12-19 DIAGNOSIS — R5383 Other fatigue: Secondary | ICD-10-CM

## 2010-12-19 DIAGNOSIS — J449 Chronic obstructive pulmonary disease, unspecified: Secondary | ICD-10-CM

## 2010-12-19 DIAGNOSIS — N401 Enlarged prostate with lower urinary tract symptoms: Secondary | ICD-10-CM

## 2010-12-19 DIAGNOSIS — K5732 Diverticulitis of large intestine without perforation or abscess without bleeding: Secondary | ICD-10-CM | POA: Insufficient documentation

## 2010-12-19 LAB — COMPREHENSIVE METABOLIC PANEL
ALT: 25 U/L (ref 0–53)
AST: 28 U/L (ref 0–37)
Albumin: 4.2 g/dL (ref 3.5–5.2)
BUN: 14 mg/dL (ref 6–23)
CO2: 29 mEq/L (ref 19–32)
Calcium: 9.3 mg/dL (ref 8.4–10.5)
Chloride: 102 mEq/L (ref 96–112)
GFR: 88.29 mL/min (ref >60.00)
Potassium: 3.5 mEq/L (ref 3.5–5.1)

## 2010-12-19 LAB — URINALYSIS, ROUTINE W REFLEX MICROSCOPIC
Specific Gravity, Urine: 1.015 (ref 1.000–1.030)
Total Protein, Urine: NEGATIVE
Urine Glucose: NEGATIVE

## 2010-12-19 LAB — LIPID PANEL
Cholesterol: 167 mg/dL (ref 0–200)
LDL Cholesterol: 78 mg/dL (ref 0–99)
Total CHOL/HDL Ratio: 2
VLDL: 21.4 mg/dL (ref 0.0–40.0)

## 2010-12-19 LAB — AMYLASE: Amylase: 56 U/L (ref 27–131)

## 2010-12-19 LAB — CBC WITH DIFFERENTIAL/PLATELET
Basophils Relative: 0.4 % (ref 0.0–3.0)
Eosinophils Absolute: 0.3 10*3/uL (ref 0.0–0.7)
Lymphocytes Relative: 22.1 % (ref 12.0–46.0)
MCHC: 35 g/dL (ref 30.0–36.0)
Monocytes Relative: 8.1 % (ref 3.0–12.0)
Neutrophils Relative %: 65.5 % (ref 43.0–77.0)
RBC: 4.96 Mil/uL (ref 4.22–5.81)
WBC: 6.5 10*3/uL (ref 4.5–10.5)

## 2010-12-19 LAB — SEDIMENTATION RATE: Sed Rate: 3 mm/hr (ref 0–22)

## 2010-12-19 MED ORDER — CIPROFLOXACIN HCL 500 MG PO TABS: 500.0000 mg | ORAL_TABLET | Freq: Two times a day (BID) | ORAL | Status: AC

## 2010-12-19 MED ORDER — METRONIDAZOLE 500 MG PO TABS: 500.0000 mg | ORAL_TABLET | Freq: Three times a day (TID) | ORAL | Status: AC

## 2010-12-19 MED ORDER — ALBUTEROL SULFATE HFA 108 (90 BASE) MCG/ACT IN AERS: 2.0000 | INHALATION_SPRAY | Freq: Four times a day (QID) | RESPIRATORY_TRACT | Status: DC

## 2010-12-19 NOTE — Telephone Encounter (Signed)
Pt c/o burning pain in his groin. He has had hernia in the past but does not feel that this is same as before. Scheduled for OV this PM for eval.

## 2010-12-19 NOTE — Assessment & Plan Note (Signed)
I will check labs to look for other causes

## 2010-12-19 NOTE — Assessment & Plan Note (Signed)
Start cipro and flagyl

## 2010-12-19 NOTE — Assessment & Plan Note (Signed)
His BP is well controlled, I will check his lytes today 

## 2010-12-19 NOTE — Patient Instructions (Signed)
Diverticulitis A diverticulum is a small pouch or sac on the colon. Diverticulosis is the presence of these diverticula on the colon. Diverticulitis is the irritation (inflammation) or infection of diverticula. CAUSES The colon and its diverticula contain germs (bacteria). If food particles block the tiny opening to a diverticulum, the bacteria inside can grow and cause an increase in pressure. This leads to infection and inflammation. SYMPTOMS  Belly (abdominal) pain and tenderness. Usually, the pain is located on the left side of your abdomen. However, it could be located elsewhere.   Fever.   Bloating.   Feeling sick to your stomach (nausea).   Throwing up (vomiting).   Abnormal stools.  DIAGNOSIS Your caregiver will take a history and perform a physical exam. Since many things can cause abdominal pain, other tests may be necessary. Tests may include:  Blood tests.  Urine tests.   X-ray of the abdomen.  CT scan of the abdomen.   Sometimes, surgery is needed to determine if diverticulitis or other conditions are causing your symptoms. TREATMENT Most of the time, you can be treated without surgery. Treatment includes:  Resting the bowels by only having liquids for a few days.   Intravenous (IV) fluids if you are losing fluids (dehydrated).   Medicines (antibiotics) that kill germs may be given.   Pain and nausea medicine, if needed.   As you improve, eating soft, easily digestible foods.   Surgery if the inflamed diverticulum has burst.  HOME CARE INSTRUCTIONS  Take all medicine as directed by your caregiver.   Try a clear liquid diet (broth, tea, or water for  , or as directed by your caregiver). You may then gradually begin a bland diet as tolerated.   You may be put on a high-fiber diet. Avoid nuts and seeds. Follow your caregiver's diet recommendations.   If your caregiver has given you a follow-up appointment, it is very important that you go. Not going could  result in lasting (chronic) or permanent injury, pain, and disability. If there is any problem keeping the appointment, call to reschedule.  SEEK MEDICAL CARE IF:  Pain does not improve.   You have a hard time advancing your diet beyond clear liquids.   Bowel movements do not return to normal.  SEEK IMMEDIATE MEDICAL CARE IF:  The pain becomes worse.   You have an oral temperature above 100.5, not controlled by medicine.   Repeated vomiting occurs.   You have bloody or black, tarry stools.   Symptoms that brought you to your caregiver become worse or are not getting better.  MAKE SURE YOU:  Understand these instructions.   Will watch your condition.   Will get help right away if you are not doing well or get worse.  Document Released: 01/18/2005 Document Re-Released: 07/05/2009 [employer] Patient Information 2011 [employer]. 

## 2010-12-19 NOTE — Assessment & Plan Note (Signed)
I will check his PSA today 

## 2010-12-19 NOTE — Progress Notes (Signed)
Subjective:    Patient ID: [name], male    DOB: [birth year], 70 y.o.   MRN: [id number]  Abdominal Pain This is a new problem. The current episode started 1 to 4 weeks ago. The onset quality is gradual. The problem occurs intermittently. The most recent episode lasted 3 weeks. The problem has been unchanged. The pain is located in the LLQ. The pain is at a severity of 2/10. The pain is mild. The quality of the pain is aching and burning. The abdominal pain does not radiate. Pertinent negatives include no anorexia, arthralgias, belching, constipation, diarrhea, dysuria, fever, flatus, frequency, headaches, hematochezia, hematuria, melena, myalgias, nausea, vomiting or weight loss. The pain is aggravated by nothing. The pain is relieved by nothing. He has tried nothing for the symptoms.      Review of Systems  Constitutional: Positive for fatigue. Negative for fever, chills, weight loss, diaphoresis, activity change, appetite change and unexpected weight change.  HENT: Negative for sore throat, facial swelling, trouble swallowing, neck pain, neck stiffness and voice change.   Eyes: Negative.   Respiratory: Negative for apnea, cough, choking, chest tightness, shortness of breath, wheezing and stridor.   Cardiovascular: Negative for chest pain, palpitations and leg swelling.  Gastrointestinal: Positive for abdominal pain. Negative for nausea, vomiting, diarrhea, constipation, blood in stool, melena, hematochezia, abdominal distention, anal bleeding, rectal pain, anorexia and flatus.  Genitourinary: Negative for dysuria, urgency, frequency, hematuria, flank pain, decreased urine volume, discharge, penile swelling, scrotal swelling, enuresis, difficulty urinating, genital sores, penile pain and testicular pain.  Musculoskeletal: Negative for myalgias, back pain, joint swelling, arthralgias and gait problem.  Skin: Negative for color change, pallor, rash and wound.  Neurological: Negative for  dizziness, tremors, seizures, syncope, facial asymmetry, speech difficulty, weakness, light-headedness, numbness and headaches.  Hematological: Negative for adenopathy. Does not bruise/bleed easily.  Psychiatric/Behavioral: Negative.        Objective:   Physical Exam  Vitals reviewed. Constitutional: He is oriented to person, place, and time. He appears well-developed and well-nourished. No distress.  HENT:  Head: Normocephalic and atraumatic.  Mouth/Throat: No oropharyngeal exudate.  Eyes: Conjunctivae and EOM are normal. Pupils are equal, round, and reactive to light. Right eye exhibits no discharge. Left eye exhibits no discharge. No scleral icterus.  Neck: Normal range of motion. Neck supple. No JVD present. No tracheal deviation present. No thyromegaly present.  Cardiovascular: Normal rate, regular rhythm, normal heart sounds and intact distal pulses.  Exam reveals no gallop and no friction rub.   No murmur heard. Pulmonary/Chest: Effort normal and breath sounds normal. No stridor. No respiratory distress. He has no wheezes. He has no rales. He exhibits no tenderness.  Abdominal: Soft. Bowel sounds are normal. He exhibits no distension and no mass. There is no tenderness. There is no rebound and no guarding. Hernia confirmed negative in the right inguinal area and confirmed negative in the left inguinal area.  Genitourinary: Testes normal and penis normal. Rectal exam shows no external hemorrhoid, no internal hemorrhoid, no fissure, no mass, no tenderness and anal tone normal. Guaiac positive stool (trace). Prostate is enlarged (2+ symmetrical enlargement with no nodules). Prostate is not tender. Right testis shows no mass, no swelling and no tenderness. Right testis is descended. Cremasteric reflex is not absent on the right side. Left testis shows no mass, no swelling and no tenderness. Left testis is descended. Cremasteric reflex is not absent on the left side. Circumcised. No  paraphimosis, penile erythema or penile tenderness. No discharge found.  Musculoskeletal: Normal range of motion. He exhibits no edema and no tenderness.  Lymphadenopathy:    He has no cervical adenopathy.       Right: No inguinal adenopathy present.       Left: No inguinal adenopathy present.  Neurological: He is alert and oriented to person, place, and time. He has normal reflexes. He displays normal reflexes. No cranial nerve deficit. He exhibits normal muscle tone. Coordination normal.  Skin: Skin is warm and dry. No rash noted. He is not diaphoretic. No erythema. No pallor.  Psychiatric: He has a normal mood and affect. His behavior is normal. Judgment and thought content normal.          Assessment & Plan:

## 2010-12-19 NOTE — Assessment & Plan Note (Signed)
I think he has diverticulitis so I will start cipro and flagyl but will also check labs, urine, and xray to look for other causes

## 2010-12-19 NOTE — Assessment & Plan Note (Signed)
He is doing well on spiriva and advair

## 2010-12-20 ENCOUNTER — Encounter: Payer: Self-pay | Admitting: Internal Medicine

## 2010-12-27 ENCOUNTER — Telehealth: Payer: Self-pay

## 2010-12-27 NOTE — Telephone Encounter (Signed)
Received fax stating ventolin HFA requires PA. Called  Insurance at  [phone] and rx approved  04/24/11

## 2011-01-03 ENCOUNTER — Encounter: Payer: Self-pay | Admitting: Internal Medicine

## 2011-01-03 ENCOUNTER — Ambulatory Visit (INDEPENDENT_AMBULATORY_CARE_PROVIDER_SITE_OTHER): Payer: Medicare Other | Admitting: Internal Medicine

## 2011-01-03 DIAGNOSIS — J449 Chronic obstructive pulmonary disease, unspecified: Secondary | ICD-10-CM

## 2011-01-03 DIAGNOSIS — I10 Essential (primary) hypertension: Secondary | ICD-10-CM

## 2011-01-03 DIAGNOSIS — Z23 Encounter for immunization: Secondary | ICD-10-CM

## 2011-01-03 DIAGNOSIS — K5732 Diverticulitis of large intestine without perforation or abscess without bleeding: Secondary | ICD-10-CM

## 2011-01-03 NOTE — Progress Notes (Signed)
  Subjective:    Patient ID: [name], male    DOB: August 09, 1940, 70 y.o.   MRN: [id number]  HPI  F/u diverticulitis - LLQ pain has resolved F/u HTN - BP 130/80 at home Review of Systems  Constitutional: Negative for appetite change, fatigue and unexpected weight change.  HENT: Negative for nosebleeds, congestion, sore throat, sneezing, trouble swallowing and neck pain.   Eyes: Negative for itching and visual disturbance.  Respiratory: Negative for cough.   Cardiovascular: Negative for chest pain, palpitations and leg swelling.  Gastrointestinal: Negative for nausea, diarrhea, blood in stool and abdominal distention.  Genitourinary: Positive for decreased urine volume. Negative for frequency and hematuria.  Musculoskeletal: Negative for back pain, joint swelling and gait problem.  Skin: Negative for rash.  Neurological: Negative for dizziness, tremors, speech difficulty and weakness.  Psychiatric/Behavioral: Negative for sleep disturbance, dysphoric mood and agitation. The patient is not nervous/anxious.        Objective:   Physical Exam  Constitutional: He is oriented to person, place, and time. He appears well-developed.  HENT:  Mouth/Throat: Oropharynx is clear and moist.  Eyes: Conjunctivae are normal. Pupils are equal, round, and reactive to light.  Neck: Normal range of motion. No JVD present. No thyromegaly present.  Cardiovascular: Normal rate, regular rhythm, normal heart sounds and intact distal pulses.  Exam reveals no gallop and no friction rub.   No murmur heard. Pulmonary/Chest: Effort normal and breath sounds normal. No respiratory distress. He has no wheezes. He has no rales. He exhibits no tenderness.  Abdominal: Soft. Bowel sounds are normal. He exhibits no distension and no mass. There is no tenderness. There is no rebound and no guarding.  Musculoskeletal: Normal range of motion. He exhibits no edema and no tenderness.  Lymphadenopathy:    He has no cervical  adenopathy.  Neurological: He is alert and oriented to person, place, and time. He has normal reflexes. No cranial nerve deficit. He exhibits normal muscle tone. Coordination normal.  Skin: Skin is warm and dry. No rash noted.  Psychiatric: He has a normal mood and affect. His behavior is normal. Judgment and thought content normal.    Lab Results  Component Value Date   WBC 6.5 12/19/2010   HGB 16.4 12/19/2010   HCT 47.0 12/19/2010   PLT 184.0 12/19/2010   CHOL 167 12/19/2010   TRIG 107.0 12/19/2010   HDL 67.60 12/19/2010   ALT 25 12/19/2010   AST 28 12/19/2010   NA 140 12/19/2010   K 3.5 12/19/2010   CL 102 12/19/2010   CREATININE 0.9 12/19/2010   BUN 14 12/19/2010   CO2 29 12/19/2010   TSH 1.16 12/19/2010   PSA 1.03 12/19/2010         Assessment & Plan:

## 2011-01-03 NOTE — Assessment & Plan Note (Signed)
BP is ok at home Continue with current prescription therapy as reflected on the Med list.  

## 2011-01-03 NOTE — Assessment & Plan Note (Signed)
Clinically resolved.  

## 2011-01-03 NOTE — Assessment & Plan Note (Signed)
Continue with current prn prescription therapy as reflected on the Med list.  

## 2011-01-11 ENCOUNTER — Ambulatory Visit (INDEPENDENT_AMBULATORY_CARE_PROVIDER_SITE_OTHER): Payer: Medicare Other | Admitting: Internal Medicine

## 2011-01-11 ENCOUNTER — Encounter: Payer: Self-pay | Admitting: Internal Medicine

## 2011-01-11 VITALS — BP 130/70 | HR 87 | Temp 98.2°F | Resp 16 | Wt 233.0 lb

## 2011-01-11 DIAGNOSIS — G4700 Insomnia, unspecified: Secondary | ICD-10-CM | POA: Insufficient documentation

## 2011-01-11 DIAGNOSIS — I10 Essential (primary) hypertension: Secondary | ICD-10-CM

## 2011-01-11 MED ORDER — ZOLPIDEM TARTRATE 3.5 MG SL SUBL: 1.0000 | SUBLINGUAL_TABLET | Freq: Every day | SUBLINGUAL | Status: DC

## 2011-01-11 NOTE — Patient Instructions (Signed)

## 2011-01-12 ENCOUNTER — Encounter: Payer: Self-pay | Admitting: Internal Medicine

## 2011-01-12 NOTE — Assessment & Plan Note (Signed)
His BP is well controlled 

## 2011-01-12 NOTE — Assessment & Plan Note (Signed)
Try intermezzo and he was given pt ed material

## 2011-01-12 NOTE — Progress Notes (Signed)
  Subjective:    Patient ID: [name], male    DOB: Oct 28, 1940, 70 y.o.   MRN: [id number]  HPI  He complains of insomnia (FA's and EMA's) and says that he wakes up during the night and thinks about things he needs to get done.  Review of Systems  Constitutional: Negative.   HENT: Negative.   Eyes: Negative.   Respiratory: Negative.   Cardiovascular: Negative.   Gastrointestinal: Negative.   Genitourinary: Negative.   Musculoskeletal: Negative.   Skin: Negative.   Neurological: Negative.   Hematological: Negative.   Psychiatric/Behavioral: Positive for sleep disturbance. Negative for suicidal ideas, hallucinations, behavioral problems, confusion, self-injury, dysphoric mood, decreased concentration and agitation. The patient is not nervous/anxious and is not hyperactive.        Objective:   Physical Exam  Vitals reviewed. Constitutional: He is oriented to person, place, and time. He appears well-developed and well-nourished. No distress.  HENT:  Mouth/Throat: Oropharynx is clear and moist. No oropharyngeal exudate.  Eyes: Conjunctivae are normal. Right eye exhibits no discharge. Left eye exhibits no discharge. No scleral icterus.  Neck: Normal range of motion. Neck supple. No JVD present. No tracheal deviation present. No thyromegaly present.  Cardiovascular: Normal rate, regular rhythm, normal heart sounds and intact distal pulses.  Exam reveals no gallop and no friction rub.   No murmur heard. Pulmonary/Chest: Effort normal and breath sounds normal. No stridor. No respiratory distress. He has no wheezes. He has no rales. He exhibits no tenderness.  Abdominal: Soft. Bowel sounds are normal. He exhibits no distension and no mass. There is no tenderness. There is no rebound and no guarding.  Musculoskeletal: Normal range of motion. He exhibits no edema and no tenderness.  Lymphadenopathy:    He has no cervical adenopathy.  Neurological: He is oriented to person, place, and time.  He displays normal reflexes. No cranial nerve deficit. He exhibits normal muscle tone. Coordination normal.  Skin: Skin is warm and dry. No rash noted. He is not diaphoretic. No erythema. No pallor.  Psychiatric: He has a normal mood and affect. His behavior is normal. Judgment and thought content normal.          Assessment & Plan:

## 2011-01-19 LAB — POCT I-STAT 4, (NA,K, GLUC, HGB,HCT)
Glucose, Bld: 97
HCT: 37 — ABNORMAL LOW
Operator id: 268271
Potassium: 3.9
Sodium: 141

## 2011-01-30 LAB — POCT I-STAT 4, (NA,K, GLUC, HGB,HCT)
HCT: 43
Hemoglobin: 14.6
Sodium: 138

## 2011-02-02 ENCOUNTER — Encounter: Payer: Self-pay | Admitting: Internal Medicine

## 2011-02-02 ENCOUNTER — Ambulatory Visit (INDEPENDENT_AMBULATORY_CARE_PROVIDER_SITE_OTHER): Payer: Medicare Other | Admitting: Internal Medicine

## 2011-02-02 VITALS — BP 140/84 | HR 74 | Temp 98.6°F | Resp 16 | Wt 232.0 lb

## 2011-02-02 DIAGNOSIS — E785 Hyperlipidemia, unspecified: Secondary | ICD-10-CM

## 2011-02-02 DIAGNOSIS — L989 Disorder of the skin and subcutaneous tissue, unspecified: Secondary | ICD-10-CM

## 2011-02-02 DIAGNOSIS — I10 Essential (primary) hypertension: Secondary | ICD-10-CM

## 2011-02-02 NOTE — Progress Notes (Signed)
  Subjective:    Patient ID: [name], male    DOB: April 06, 1941, 70 y.o.   MRN: [id number]  Hypertension This is a chronic problem. The current episode started more than 1 year ago. The problem has been gradually improving since onset. The problem is controlled. Pertinent negatives include no anxiety, blurred vision, chest pain, headaches, malaise/fatigue, neck pain, orthopnea, palpitations, peripheral edema, PND, shortness of breath or sweats. Past treatments include diuretics. The current treatment provides significant improvement. There are no compliance problems.       Review of Systems  Constitutional: Negative.  Negative for malaise/fatigue.  HENT: Negative.  Negative for neck pain.   Eyes: Negative.  Negative for blurred vision.  Respiratory: Negative.  Negative for shortness of breath.   Cardiovascular: Negative.  Negative for chest pain, palpitations, orthopnea and PND.  Gastrointestinal: Negative.   Genitourinary: Negative.   Musculoskeletal: Negative for myalgias, back pain, joint swelling and arthralgias.  Skin: Positive for color change (crusty lesion right ear). Negative for pallor, rash and wound.  Neurological: Negative.  Negative for headaches.  Hematological: Negative.   Psychiatric/Behavioral: Negative.        Objective:   Physical Exam  Vitals reviewed. Constitutional: He is oriented to person, place, and time. He appears well-developed and well-nourished. No distress.  HENT:  Head: Normocephalic and atraumatic.  Ears:  Mouth/Throat: Oropharynx is clear and moist. No oropharyngeal exudate.  Eyes: Conjunctivae are normal. Right eye exhibits no discharge. Left eye exhibits no discharge. No scleral icterus.  Neck: Normal range of motion. Neck supple. No JVD present. No tracheal deviation present. No thyromegaly present.  Cardiovascular: Normal rate, regular rhythm, normal heart sounds and intact distal pulses.  Exam reveals no gallop and no friction rub.   No  murmur heard. Pulmonary/Chest: Effort normal and breath sounds normal. No stridor. No respiratory distress. He has no wheezes. He has no rales. He exhibits no tenderness.  Abdominal: Soft. Bowel sounds are normal. He exhibits no distension and no mass. There is no tenderness. There is no rebound and no guarding.  Musculoskeletal: Normal range of motion. He exhibits no edema and no tenderness.  Lymphadenopathy:    He has no cervical adenopathy.  Neurological: He is oriented to person, place, and time. He displays normal reflexes. He exhibits normal muscle tone. Coordination normal.  Skin: Skin is warm and dry. No rash noted. He is not diaphoretic. No erythema. No pallor.  Psychiatric: He has a normal mood and affect. His behavior is normal. Judgment and thought content normal.      Lab Results  Component Value Date   WBC 6.5 12/19/2010   HGB 16.4 12/19/2010   HCT 47.0 12/19/2010   PLT 184.0 12/19/2010   GLUCOSE 92 12/19/2010   CHOL 167 12/19/2010   TRIG 107.0 12/19/2010   HDL 67.60 12/19/2010   LDLCALC 78 12/19/2010   ALT 25 12/19/2010   AST 28 12/19/2010   NA 140 12/19/2010   K 3.5 12/19/2010   CL 102 12/19/2010   CREATININE 0.9 12/19/2010   BUN 14 12/19/2010   CO2 29 12/19/2010   TSH 1.16 12/19/2010   PSA 1.03 12/19/2010      Assessment & Plan:

## 2011-02-02 NOTE — Assessment & Plan Note (Signed)
Derm referral.

## 2011-02-02 NOTE — Assessment & Plan Note (Signed)
Continue crestor 

## 2011-02-02 NOTE — Assessment & Plan Note (Signed)
BP is well controlled 

## 2011-02-02 NOTE — Patient Instructions (Signed)

## 2011-02-27 ENCOUNTER — Other Ambulatory Visit: Payer: Self-pay | Admitting: Internal Medicine

## 2011-02-28 ENCOUNTER — Telehealth: Payer: Self-pay

## 2011-02-28 MED ORDER — CHLORTHALIDONE 25 MG PO TABS: 25.0000 mg | ORAL_TABLET | Freq: Every day | ORAL | Status: DC

## 2011-02-28 NOTE — Telephone Encounter (Signed)
Med refill

## 2011-06-07 ENCOUNTER — Encounter: Payer: Self-pay | Admitting: Internal Medicine

## 2011-06-07 ENCOUNTER — Other Ambulatory Visit (INDEPENDENT_AMBULATORY_CARE_PROVIDER_SITE_OTHER): Payer: Medicare Other

## 2011-06-07 ENCOUNTER — Ambulatory Visit (INDEPENDENT_AMBULATORY_CARE_PROVIDER_SITE_OTHER): Payer: Medicare Other | Admitting: Internal Medicine

## 2011-06-07 DIAGNOSIS — I10 Essential (primary) hypertension: Secondary | ICD-10-CM

## 2011-06-07 DIAGNOSIS — E785 Hyperlipidemia, unspecified: Secondary | ICD-10-CM

## 2011-06-07 DIAGNOSIS — F17200 Nicotine dependence, unspecified, uncomplicated: Secondary | ICD-10-CM

## 2011-06-07 DIAGNOSIS — J449 Chronic obstructive pulmonary disease, unspecified: Secondary | ICD-10-CM

## 2011-06-07 LAB — COMPREHENSIVE METABOLIC PANEL
ALT: 31 U/L (ref 0–53)
AST: 23 U/L (ref 0–37)
Albumin: 4.1 g/dL (ref 3.5–5.2)
Alkaline Phosphatase: 33 U/L — ABNORMAL LOW (ref 39–117)
BUN: 13 mg/dL (ref 6–23)
Calcium: 9.5 mg/dL (ref 8.4–10.5)
Chloride: 104 mEq/L (ref 96–112)
Potassium: 3.9 mEq/L (ref 3.5–5.1)
Sodium: 140 mEq/L (ref 135–145)

## 2011-06-07 LAB — LIPID PANEL
LDL Cholesterol: 77 mg/dL (ref 0–99)
Total CHOL/HDL Ratio: 2
VLDL: 20.4 mg/dL (ref 0.0–40.0)

## 2011-06-07 MED ORDER — ATORVASTATIN CALCIUM 80 MG PO TABS: 80.0000 mg | ORAL_TABLET | Freq: Every day | ORAL | Status: DC

## 2011-06-07 NOTE — Assessment & Plan Note (Signed)
BP is well controlled, I will check his lytes and renal function today 

## 2011-06-07 NOTE — Assessment & Plan Note (Signed)
He agrees to try to quit smoking but he does not want to try an Rx option so I gave him pt ed material

## 2011-06-07 NOTE — Progress Notes (Signed)
  Subjective:    Patient ID: [name], male    DOB: 08/22/1940, 71 y.o.   MRN: [id number]  Hyperlipidemia This is a chronic problem. The current episode started more than 1 year ago. The problem is controlled. Recent lipid tests were reviewed and are variable. Exacerbating diseases include obesity. He has no history of chronic renal disease, diabetes, hypothyroidism, liver disease or nephrotic syndrome. Factors aggravating his hyperlipidemia include thiazides and fatty foods. Pertinent negatives include no chest pain, focal sensory loss, focal weakness, leg pain, myalgias or shortness of breath. Current antihyperlipidemic treatment includes statins. The current treatment provides significant improvement of lipids. Compliance problems include medication cost, adherence to exercise and adherence to diet.       Review of Systems  Constitutional: Negative for fever, chills, diaphoresis, activity change, appetite change, fatigue and unexpected weight change.  HENT: Negative.   Eyes: Negative.   Respiratory: Negative for cough, choking, chest tightness, shortness of breath, wheezing and stridor.   Cardiovascular: Negative for chest pain, palpitations and leg swelling.  Gastrointestinal: Negative.   Genitourinary: Negative.   Musculoskeletal: Negative.  Negative for myalgias, back pain, arthralgias and gait problem.  Skin: Negative for color change, pallor, rash and wound.  Neurological: Negative.  Negative for focal weakness.  Hematological: Negative.   Psychiatric/Behavioral: Negative.        Objective:   Physical Exam  Vitals reviewed. Constitutional: He is oriented to person, place, and time. He appears well-developed and well-nourished. No distress.  HENT:  Head: Normocephalic and atraumatic.  Nose: Nose normal.  Mouth/Throat: Oropharynx is clear and moist. No oropharyngeal exudate.  Eyes: Conjunctivae are normal. Right eye exhibits no discharge. Left eye exhibits no discharge. No  scleral icterus.  Neck: Normal range of motion. Neck supple. No JVD present. No tracheal deviation present. No thyromegaly present.  Cardiovascular: Normal rate, regular rhythm and intact distal pulses.  Exam reveals no gallop and no friction rub.   No murmur heard. Pulmonary/Chest: Effort normal and breath sounds normal. No stridor. No respiratory distress. He has no wheezes. He has no rales. He exhibits no tenderness.  Abdominal: Soft. Bowel sounds are normal. He exhibits no distension and no mass. There is no tenderness. There is no rebound and no guarding.  Musculoskeletal: Normal range of motion. He exhibits no edema.  Lymphadenopathy:    He has no cervical adenopathy.  Neurological: He is oriented to person, place, and time.  Skin: Skin is warm and dry. No rash noted. He is not diaphoretic. No erythema. No pallor.  Psychiatric: He has a normal mood and affect. His behavior is normal. Judgment and thought content normal.      Lab Results  Component Value Date   WBC 6.5 12/19/2010   HGB 16.4 12/19/2010   HCT 47.0 12/19/2010   PLT 184.0 12/19/2010   GLUCOSE 92 12/19/2010   CHOL 167 12/19/2010   TRIG 107.0 12/19/2010   HDL 67.60 12/19/2010   LDLCALC 78 12/19/2010   ALT 25 12/19/2010   AST 28 12/19/2010   NA 140 12/19/2010   K 3.5 12/19/2010   CL 102 12/19/2010   CREATININE 0.9 12/19/2010   BUN 14 12/19/2010   CO2 29 12/19/2010   TSH 1.16 12/19/2010   PSA 1.03 12/19/2010      Assessment & Plan:

## 2011-06-07 NOTE — Assessment & Plan Note (Signed)
Change to generic atorvastatin at his request

## 2011-06-07 NOTE — Assessment & Plan Note (Signed)
Continue current inhalers and he agrees to try to quit smoking

## 2011-06-07 NOTE — Patient Instructions (Signed)
Hypertension As your heart beats, it forces blood through your arteries. This force is your blood pressure. If the pressure is too high, it is called hypertension (HTN) or high blood pressure. HTN is dangerous because you may have it and not know it. High blood pressure may mean that your heart has to work harder to pump blood. Your arteries may be narrow or stiff. The extra work puts you at risk for heart disease, stroke, and other problems.  Blood pressure consists of two numbers, a higher number over a lower, 110/72, for example. It is stated as "110 over 72." The ideal is below 120 for the top number (systolic) and under 80 for the bottom (diastolic). Write down your blood pressure today. You should pay close attention to your blood pressure if you have certain conditions such as:  Heart failure.   Prior heart attack.   Diabetes   Chronic kidney disease.   Prior stroke.   Multiple risk factors for heart disease.  To see if you have HTN, your blood pressure should be measured while you are seated with your arm held at the level of the heart. It should be measured at least twice. A one-time elevated blood pressure reading (especially in the Emergency Department) does not mean that you need treatment. There may be conditions in which the blood pressure is different between your right and left arms. It is important to see your caregiver soon for a recheck. Most people have essential hypertension which means that there is not a specific cause. This type of high blood pressure may be lowered by changing lifestyle factors such as:  Stress.   Smoking.   Lack of exercise.   Excessive weight.   Drug/tobacco/alcohol use.   Eating less salt.  Most people do not have symptoms from high blood pressure until it has caused damage to the body. Effective treatment can often prevent, delay or reduce that damage. TREATMENT  When a cause has been identified, treatment for high blood pressure is  directed at the cause. There are a large number of medications to treat HTN. These fall into several categories, and your caregiver will help you select the medicines that are best for you. Medications may have side effects. You should review side effects with your caregiver. If your blood pressure stays high after you have made lifestyle changes or started on medicines,   Your medication(s) may need to be changed.   Other problems may need to be addressed.   Be certain you understand your prescriptions, and know how and when to take your medicine.   Be sure to follow up with your caregiver within the time frame advised (usually within two weeks) to have your blood pressure rechecked and to review your medications.   If you are taking more than one medicine to lower your blood pressure, make sure you know how and at what times they should be taken. Taking two medicines at the same time can result in blood pressure that is too low.  SEEK IMMEDIATE MEDICAL CARE IF:  You develop a severe headache, blurred or changing vision, or confusion.   You have unusual weakness or numbness, or a faint feeling.   You have severe chest or abdominal pain, vomiting, or breathing problems.  MAKE SURE YOU:   Understand these instructions.   Will watch your condition.   Will get help right away if you are not doing well or get worse.  Document Released: 04/10/2005 Document Revised: 12/21/2010 Document Reviewed:   11/29/2007 [employer] Patient Information 2012 [place], [place].Smoking Cessation This document explains the best ways for you to quit smoking and new treatments to help. It lists new medicines that can double or triple your chances of quitting and quitting for good. It also considers ways to avoid relapses and concerns you may have about quitting, including weight gain. NICOTINE: A POWERFUL ADDICTION If you have tried to quit smoking, you know how hard it can be. It is hard because nicotine is a very  addictive drug. For some people, it can be as addictive as heroin or cocaine. Usually, people make 2 or 3 tries, or more, before finally being able to quit. Each time you try to quit, you can learn about what helps and what hurts. Quitting takes hard work and a lot of effort, but you can quit smoking. QUITTING SMOKING IS ONE OF THE MOST IMPORTANT THINGS YOU WILL EVER DO.  You will live longer, feel better, and live better.   The impact on your body of quitting smoking is felt almost immediately:   Within 20 minutes, blood pressure decreases. Pulse returns to its normal level.   After 8 hours, carbon monoxide levels in the blood return to normal. Oxygen level increases.   After 24 hours, chance of heart attack starts to decrease. Breath, hair, and body stop smelling like smoke.   After 48 hours, damaged nerve endings begin to recover. Sense of taste and smell improve.   After 72 hours, the body is virtually free of nicotine. Bronchial tubes relax and breathing becomes easier.   After 2 to 12 weeks, lungs can hold more air. Exercise becomes easier and circulation improves.   Quitting will reduce your risk of having a heart attack, stroke, cancer, or lung disease:   After 1 year, the risk of coronary heart disease is cut in half.   After 5 years, the risk of stroke falls to the same as a nonsmoker.   After 10 years, the risk of lung cancer is cut in half and the risk of other cancers decreases significantly.   After 15 years, the risk of coronary heart disease drops, usually to the level of a nonsmoker.   If you are pregnant, quitting smoking will improve your chances of having a healthy baby.   The people you live with, especially your children, will be healthier.   You will have extra money to spend on things other than cigarettes.  FIVE KEYS TO QUITTING Studies have shown that these 5 steps will help you quit smoking and quit for good. You have the best chances of quitting if you  use them together: 1. Get ready.  2. Get support and encouragement.  3. Learn new skills and behaviors.  4. Get medicine to reduce your nicotine addiction and use it correctly.  5. Be prepared for relapse or difficult situations. Be determined to continue trying to quit, even if you do not succeed at first.  1. GET READY  Set a quit date.   Change your environment.   Get rid of ALL cigarettes, ashtrays, matches, and lighters in your home, car, and place of work.   Do not let people smoke in your home.   Review your past attempts to quit. Think about what worked and what did not.   Once you quit, do not smoke. NOT EVEN A PUFF!  2. GET SUPPORT AND ENCOURAGEMENT Studies have shown that you have a better chance of being successful if you have help. You can  get support in many ways.  Tell your family, friends, and coworkers that you are going to quit and need their support. Ask them not to smoke around you.   Talk to your caregivers (doctor, dentist, nurse, pharmacist, psychologist, and/or smoking counselor).   Get individual, group, or telephone counseling and support. The more counseling you have, the better your chances are of quitting. Programs are available at [employer] and health centers. Call your local health department for information about programs in your area.   Spiritual beliefs and practices may help some smokers quit.   Quit meters are Photographer that keep track of quit statistics, such as amount of "quit-time," cigarettes not smoked, and money saved.   Many smokers find one or more of the many self-help books available useful in helping them quit and stay off tobacco.  3. LEARN NEW SKILLS AND BEHAVIORS  Try to distract yourself from urges to smoke. Talk to someone, go for a walk, or occupy your time with a task.   When you first try to quit, change your routine. Take a different route to work. Drink tea instead of coffee. Eat  breakfast in a different place.   Do something to reduce your stress. Take a hot bath, exercise, or read a book.   Plan something enjoyable to do every day. Reward yourself for not smoking.   Explore interactive web-based programs that specialize in helping you quit.  4. GET MEDICINE AND USE IT CORRECTLY Medicines can help you stop smoking and decrease the urge to smoke. Combining medicine with the above behavioral methods and support can quadruple your chances of successfully quitting smoking. The U.[place]. Food and Drug Administration (FDA) has approved 7 medicines to help you quit smoking. These medicines fall into 3 categories.  Nicotine replacement therapy (delivers nicotine to your body without the negative effects and risks of smoking):   Nicotine gum: Available over-the-counter.   Nicotine lozenges: Available over-the-counter.   Nicotine inhaler: Available by prescription.   Nicotine nasal spray: Available by prescription.   Nicotine skin patches (transdermal): Available by prescription and over-the-counter.   Antidepressant medicine (helps people abstain from smoking, but how this works is unknown):   Bupropion sustained-release (SR) tablets: Available by prescription.   Nicotinic receptor partial agonist (simulates the effect of nicotine in your brain):   Varenicline tartrate tablets: Available by prescription.   Ask your caregiver for advice about which medicines to use and how to use them. Carefully read the information on the package.   Everyone who is trying to quit may benefit from using a medicine. If you are pregnant or trying to become pregnant, nursing an infant, you are under age 24, or you smoke fewer than 10 cigarettes per day, talk to your caregiver before taking any nicotine replacement medicines.   You should stop using a nicotine replacement product and call your caregiver if you experience nausea, dizziness, weakness, vomiting, fast or irregular heartbeat,  mouth problems with the lozenge or gum, or redness or swelling of the skin around the patch that does not go away.   Do not use any other product containing nicotine while using a nicotine replacement product.   Talk to your caregiver before using these products if you have diabetes, heart disease, asthma, stomach ulcers, you had a recent heart attack, you have high blood pressure that is not controlled with medicine, a history of irregular heartbeat, or you have been prescribed medicine to help you quit smoking.  5. BE PREPARED FOR RELAPSE OR DIFFICULT SITUATIONS  Most relapses occur within the first 3 months after quitting. Do not be discouraged if you start smoking again. Remember, most people try several times before they finally quit.   You may have symptoms of withdrawal because your body is used to nicotine. You may crave cigarettes, be irritable, feel very hungry, cough often, get headaches, or have difficulty concentrating.   The withdrawal symptoms are only temporary. They are strongest when you first quit, but they will go away within 10 to 14 days.  Here are some difficult situations to watch for:  Alcohol. Avoid drinking alcohol. Drinking lowers your chances of successfully quitting.   Caffeine. Try to reduce the amount of caffeine you consume. It also lowers your chances of successfully quitting.   Other smokers. Being around smoking can make you want to smoke. Avoid smokers.   Weight gain. Many smokers will gain weight when they quit, usually less than 10 pounds. Eat a healthy diet and stay active. Do not let weight gain distract you from your main goal, quitting smoking. Some medicines that help you quit smoking may also help delay weight gain. You can always lose the weight gained after you quit.   Bad mood or depression. There are a lot of ways to improve your mood other than smoking.  If you are having problems with any of these situations, talk to your caregiver. SPECIAL  SITUATIONS AND CONDITIONS Studies suggest that everyone can quit smoking. Your situation or condition can give you a special reason to quit.  Pregnant women/new mothers: By quitting, you protect your baby's health and your own.   Hospitalized patients: By quitting, you reduce health problems and help healing.   Heart attack patients: By quitting, you reduce your risk of a second heart attack.   Lung, head, and neck cancer patients: By quitting, you reduce your chance of a second cancer.   Parents of children and adolescents: By quitting, you protect your children from illnesses caused by secondhand smoke.  QUESTIONS TO THINK ABOUT Think about the following questions before you try to stop smoking. You may want to talk about your answers with your caregiver.  Why do you want to quit?   If you tried to quit in the past, what helped and what did not?   What will be the most difficult situations for you after you quit? How will you plan to handle them?   Who can help you through the tough times? Your family? Friends? Caregiver?   What pleasures do you get from smoking? What ways can you still get pleasure if you quit?  Here are some questions to ask your caregiver:  How can you help me to be successful at quitting?   What medicine do you think would be best for me and how should I take it?   What should I do if I need more help?   What is smoking withdrawal like? How can I get information on withdrawal?  Quitting takes hard work and a lot of effort, but you can quit smoking. FOR MORE INFORMATION  Smokefree.gov (http://www.davis-sullivan.com/) provides free, accurate, evidence-based information and professional assistance to help support the immediate and long-term needs of people trying to quit smoking. Document Released: 04/04/2001 Document Revised: 12/21/2010 Document Reviewed: 01/25/2009 [facility] Patient Information 2012 [place], [place].

## 2011-08-21 ENCOUNTER — Ambulatory Visit (INDEPENDENT_AMBULATORY_CARE_PROVIDER_SITE_OTHER)
Admission: RE | Admit: 2011-08-21 | Discharge: 2011-08-21 | Disposition: A | Discharge status: Home / Self Care | Payer: Medicare Other | Source: Ambulatory Visit | Attending: Internal Medicine | Admitting: Internal Medicine

## 2011-08-21 ENCOUNTER — Encounter: Payer: Self-pay | Admitting: Internal Medicine

## 2011-08-21 ENCOUNTER — Ambulatory Visit (INDEPENDENT_AMBULATORY_CARE_PROVIDER_SITE_OTHER): Payer: Medicare Other | Admitting: Internal Medicine

## 2011-08-21 VITALS — BP 134/70 | HR 90 | Temp 98.4°F | Resp 16 | Wt 242.5 lb

## 2011-08-21 DIAGNOSIS — M25519 Pain in unspecified shoulder: Secondary | ICD-10-CM

## 2011-08-21 DIAGNOSIS — M25511 Pain in right shoulder: Secondary | ICD-10-CM

## 2011-08-21 DIAGNOSIS — I10 Essential (primary) hypertension: Secondary | ICD-10-CM

## 2011-08-21 MED ORDER — NAPROXEN SODIUM ER 500 MG PO TB24: 500.0000 mg | ORAL_TABLET | Freq: Every day | ORAL | Status: DC

## 2011-08-21 NOTE — Assessment & Plan Note (Signed)
His BP is well controlled 

## 2011-08-21 NOTE — Patient Instructions (Signed)
Shoulder Pain The shoulder is a ball and socket joint. The muscles and tendons (rotator cuff) are what keep the shoulder in its joint and stable. This collection of muscles and tendons holds in the head (ball) of the humerus (upper arm bone) in the fossa (cup) of the scapula (shoulder blade). Today no reason was found for your shoulder pain. Often pain in the shoulder may be treated conservatively with temporary immobilization. For example, holding the shoulder in one place using a sling for rest. Physical therapy may be needed if problems continue. HOME CARE INSTRUCTIONS   Apply ice to the sore area for 15 to 20 minutes, 3 to 4 times per day for the first 2 days. Put the ice in a plastic bag. Place a towel between the bag of ice and your skin.   If you have or were given a shoulder sling and straps, do not remove for as long as directed by your caregiver or until you see a caregiver for a follow-up examination. If you need to remove it to shower or bathe, move your arm as little as possible.   Sleep on several pillows at night to lessen swelling and pain.   Only take over-the-counter or prescription medicines for pain, discomfort, or fever as directed by your caregiver.   Keep any follow-up appointments in order to avoid any type of permanent shoulder disability or chronic pain problems.  SEEK MEDICAL CARE IF:   Pain in your shoulder increases or new pain develops in your arm, hand, or fingers.   Your hand or fingers are colder than your other hand.   You do not obtain pain relief with the medications or your pain becomes worse.  SEEK IMMEDIATE MEDICAL CARE IF:   Your arm, hand, or fingers are numb or tingling.   Your arm, hand, or fingers are swollen, painful, or turn white or blue.   You develop chest pain or shortness of breath.  MAKE SURE YOU:   Understand these instructions.   Will watch your condition.   Will get help right away if you are not doing well or get worse.    Document Released: 01/18/2005 Document Revised: 03/30/2011 Document Reviewed: 03/25/2011 [employer] Patient Information 2012 [employer]. 

## 2011-08-21 NOTE — Assessment & Plan Note (Signed)
Steroid injection was done, I will check a plain film today to look for spurs/avn/djd, he can try some nsaids, he was given pt ed material

## 2011-08-21 NOTE — Progress Notes (Signed)
Subjective:    Patient ID: [name], male    DOB: Dec 21, 1940, 71 y.o.   MRN: [id number]  Shoulder Pain  The pain is present in the right shoulder. This is a recurrent problem. The current episode started more than 1 year ago. There has been no history of extremity trauma. The problem occurs intermittently. The problem has been gradually worsening. The quality of the pain is described as sharp. The pain is at a severity of 6/10. The pain is moderate. Associated symptoms include a limited range of motion and stiffness. Pertinent negatives include no fever, inability to bear weight, itching, joint locking, joint swelling, numbness or tingling. The symptoms are aggravated by activity. He has tried acetaminophen for the symptoms. The treatment provided no relief.      Review of Systems  Constitutional: Negative for fever, chills, diaphoresis, activity change, appetite change, fatigue and unexpected weight change.  HENT: Negative.   Eyes: Negative.   Respiratory: Negative for cough, chest tightness, shortness of breath, wheezing and stridor.   Cardiovascular: Negative for chest pain, palpitations and leg swelling.  Gastrointestinal: Negative for nausea, vomiting, abdominal pain, diarrhea, constipation and blood in stool.  Genitourinary: Negative.   Musculoskeletal: Positive for arthralgias (right shoulder) and stiffness. Negative for myalgias, back pain, joint swelling and gait problem.  Skin: Negative for color change, itching, pallor, rash and wound.  Neurological: Negative.  Negative for tingling and numbness.  Hematological: Negative for adenopathy. Does not bruise/bleed easily.  Psychiatric/Behavioral: Negative.        Objective:   Physical Exam  Vitals reviewed. Constitutional: He appears well-developed and well-nourished. No distress.  HENT:  Head: Normocephalic and atraumatic.  Mouth/Throat: Oropharynx is clear and moist. No oropharyngeal exudate.  Eyes: Conjunctivae are normal.  Right eye exhibits no discharge. Left eye exhibits no discharge. No scleral icterus.  Neck: Neck supple. No JVD present. No tracheal deviation present. No thyromegaly present.  Cardiovascular: Normal rate, regular rhythm, normal heart sounds and intact distal pulses.  Exam reveals no gallop and no friction rub.   No murmur heard. Pulmonary/Chest: Effort normal and breath sounds normal. No respiratory distress. He has no wheezes. He has no rales. He exhibits no tenderness.  Abdominal: Soft. Bowel sounds are normal. He exhibits no distension and no mass. There is no tenderness. There is no rebound and no guarding.  Musculoskeletal:       Right shoulder: He exhibits decreased range of motion, tenderness and bony tenderness (in the [facility] joint and anterior area). He exhibits no swelling, no effusion, no crepitus, no deformity, no laceration, no pain, no spasm, normal pulse and normal strength.       The right shoulder was prepped and draped in sterile fashion and local anesthesia was obtained with 2% plain lido, then a 1 in 30 g needle was used to inject .5 ml depo-medrol and .5 ml of 2% plain lido into the joint space thru the area just below the [facility] joint, the material entered the joint space with no resistance, he tolerated the proc well with no complications  Lymphadenopathy:    He has no cervical adenopathy.  Skin: He is not diaphoretic.      Lab Results  Component Value Date   WBC 6.5 12/19/2010   HGB 16.4 12/19/2010   HCT 47.0 12/19/2010   PLT 184.0 12/19/2010   GLUCOSE 97 06/07/2011   CHOL 163 06/07/2011   TRIG 102.0 06/07/2011   HDL 65.50 06/07/2011   LDLCALC 77 06/07/2011  ALT 31 06/07/2011   AST 23 06/07/2011   NA 140 06/07/2011   K 3.9 06/07/2011   CL 104 06/07/2011   CREATININE 0.9 06/07/2011   BUN 13 06/07/2011   CO2 30 06/07/2011   TSH 1.70 06/07/2011   PSA 1.03 12/19/2010      Assessment & Plan:

## 2011-09-21 ENCOUNTER — Ambulatory Visit: Payer: Medicare Other | Admitting: Internal Medicine

## 2011-09-22 ENCOUNTER — Ambulatory Visit (INDEPENDENT_AMBULATORY_CARE_PROVIDER_SITE_OTHER): Payer: Medicare Other | Admitting: Internal Medicine

## 2011-09-22 ENCOUNTER — Encounter: Payer: Self-pay | Admitting: Internal Medicine

## 2011-09-22 VITALS — BP 130/64 | HR 78 | Temp 98.1°F | Resp 16 | Wt 241.2 lb

## 2011-09-22 DIAGNOSIS — E785 Hyperlipidemia, unspecified: Secondary | ICD-10-CM

## 2011-09-22 DIAGNOSIS — J449 Chronic obstructive pulmonary disease, unspecified: Secondary | ICD-10-CM

## 2011-09-22 DIAGNOSIS — I10 Essential (primary) hypertension: Secondary | ICD-10-CM

## 2011-09-22 DIAGNOSIS — M1990 Unspecified osteoarthritis, unspecified site: Secondary | ICD-10-CM

## 2011-09-22 NOTE — Assessment & Plan Note (Signed)
His BP is well controlled 

## 2011-09-22 NOTE — Assessment & Plan Note (Signed)
He will be more compliant with his inhalers, he is not ready to quit smoking

## 2011-09-22 NOTE — Assessment & Plan Note (Signed)
He is doing well on lipitor 

## 2011-09-22 NOTE — Assessment & Plan Note (Signed)
No complaints about this today

## 2011-09-22 NOTE — Progress Notes (Signed)
Subjective:    Patient ID: [name], male    DOB: [birth year], 71 y.o.   MRN: [id number]  Hypertension This is a chronic problem. The current episode started more than 1 year ago. The problem has been gradually improving since onset. The problem is controlled. Pertinent negatives include no anxiety, blurred vision, chest pain, headaches, malaise/fatigue, neck pain, orthopnea, palpitations, peripheral edema, PND, shortness of breath or sweats. Agents associated with hypertension include NSAIDs. Past treatments include diuretics. The current treatment provides moderate improvement. Compliance problems include exercise and diet.       Review of Systems  Constitutional: Negative for fever, chills, malaise/fatigue, diaphoresis, activity change, appetite change, fatigue and unexpected weight change.  HENT: Negative.  Negative for neck pain.   Eyes: Negative.  Negative for blurred vision.  Respiratory: Positive for wheezing. Negative for apnea, cough, choking, chest tightness, shortness of breath and stridor.   Cardiovascular: Negative for chest pain, palpitations, orthopnea, leg swelling and PND.  Gastrointestinal: Negative for nausea, vomiting, abdominal pain, diarrhea, constipation and blood in stool.  Genitourinary: Negative.   Musculoskeletal: Negative for myalgias, back pain, joint swelling, arthralgias and gait problem.  Skin: Negative for color change, pallor, rash and wound.  Neurological: Negative.  Negative for dizziness, tremors, seizures, syncope, facial asymmetry, speech difficulty, weakness, light-headedness, numbness and headaches.  Hematological: Negative for adenopathy. Does not bruise/bleed easily.  Psychiatric/Behavioral: Negative.        Objective:   Physical Exam  Vitals reviewed. Constitutional: He is oriented to person, place, and time. He appears well-developed and well-nourished. No distress.  HENT:  Head: Normocephalic and atraumatic.  Mouth/Throat: Oropharynx is  clear and moist. No oropharyngeal exudate.  Eyes: Conjunctivae are normal. Right eye exhibits no discharge. Left eye exhibits no discharge. No scleral icterus.  Neck: Normal range of motion. Neck supple. No JVD present. No tracheal deviation present. No thyromegaly present.  Cardiovascular: Normal rate, regular rhythm, normal heart sounds and intact distal pulses.  Exam reveals no gallop and no friction rub.   No murmur heard. Pulmonary/Chest: Effort normal. No accessory muscle usage or stridor. Not tachypneic. No respiratory distress. He has no decreased breath sounds. He has wheezes in the right middle field and the left middle field. He has rhonchi in the right middle field and the left middle field. He has no rales. He exhibits no tenderness.  Abdominal: Soft. Bowel sounds are normal. He exhibits no distension and no mass. There is no tenderness. There is no rebound and no guarding.  Musculoskeletal: Normal range of motion. He exhibits no edema and no tenderness.  Lymphadenopathy:    He has no cervical adenopathy.  Neurological: He is oriented to person, place, and time.  Skin: Skin is warm and dry. No rash noted. He is not diaphoretic. No erythema. No pallor.  Psychiatric: He has a normal mood and affect. His behavior is normal. Judgment and thought content normal.     Lab Results  Component Value Date   WBC 6.5 12/19/2010   HGB 16.4 12/19/2010   HCT 47.0 12/19/2010   PLT 184.0 12/19/2010   GLUCOSE 97 06/07/2011   CHOL 163 06/07/2011   TRIG 102.0 06/07/2011   HDL 65.50 06/07/2011   LDLCALC 77 06/07/2011   ALT 31 06/07/2011   AST 23 06/07/2011   NA 140 06/07/2011   K 3.9 06/07/2011   CL 104 06/07/2011   CREATININE 0.9 06/07/2011   BUN 13 06/07/2011   CO2 30 06/07/2011   TSH 1.70 06/07/2011  PSA 1.03 12/19/2010      Assessment & Plan:

## 2011-09-22 NOTE — Patient Instructions (Signed)

## 2011-09-28 ENCOUNTER — Other Ambulatory Visit: Payer: Self-pay | Admitting: Internal Medicine

## 2011-11-20 ENCOUNTER — Other Ambulatory Visit: Payer: Self-pay | Admitting: Internal Medicine

## 2011-12-06 ENCOUNTER — Ambulatory Visit: Payer: Medicare Other | Admitting: Internal Medicine

## 2011-12-19 ENCOUNTER — Telehealth: Payer: Self-pay | Admitting: *Deleted

## 2011-12-19 MED ORDER — TIOTROPIUM BROMIDE MONOHYDRATE 18 MCG IN CAPS: 18.0000 ug | ORAL_CAPSULE | Freq: Every day | RESPIRATORY_TRACT | Status: DC

## 2011-12-19 MED ORDER — FLUTICASONE-SALMETEROL 250-50 MCG/DOSE IN AEPB: 1.0000 | INHALATION_SPRAY | Freq: Two times a day (BID) | RESPIRATORY_TRACT | Status: DC

## 2011-12-19 NOTE — Telephone Encounter (Signed)
Called pt back spoke with wife sent rx's to [facility].../LMB

## 2011-12-19 NOTE — Telephone Encounter (Signed)
Left msg on triage needing spiriva & advair transfer to [facility].../LMB

## 2012-01-02 ENCOUNTER — Other Ambulatory Visit: Payer: Self-pay | Admitting: Internal Medicine

## 2012-03-12 ENCOUNTER — Ambulatory Visit (INDEPENDENT_AMBULATORY_CARE_PROVIDER_SITE_OTHER): Payer: Medicare Other | Admitting: Internal Medicine

## 2012-03-12 ENCOUNTER — Encounter: Payer: Self-pay | Admitting: Internal Medicine

## 2012-03-12 ENCOUNTER — Ambulatory Visit (INDEPENDENT_AMBULATORY_CARE_PROVIDER_SITE_OTHER)
Admission: RE | Admit: 2012-03-12 | Discharge: 2012-03-12 | Disposition: A | Discharge status: Home / Self Care | Payer: Medicare Other | Source: Ambulatory Visit | Attending: Internal Medicine | Admitting: Internal Medicine

## 2012-03-12 VITALS — BP 112/52 | HR 107 | Temp 97.5°F | Resp 22 | Ht 73.75 in | Wt 232.8 lb

## 2012-03-12 DIAGNOSIS — R05 Cough: Secondary | ICD-10-CM

## 2012-03-12 DIAGNOSIS — J449 Chronic obstructive pulmonary disease, unspecified: Secondary | ICD-10-CM

## 2012-03-12 DIAGNOSIS — J441 Chronic obstructive pulmonary disease with (acute) exacerbation: Secondary | ICD-10-CM

## 2012-03-12 MED ORDER — ARFORMOTEROL TARTRATE 15 MCG/2ML IN NEBU: 15.0000 ug | INHALATION_SOLUTION | Freq: Once | RESPIRATORY_TRACT | Status: DC

## 2012-03-12 MED ORDER — LEVOFLOXACIN 750 MG PO TABS: 750.0000 mg | ORAL_TABLET | Freq: Every day | ORAL | Status: AC

## 2012-03-12 MED ORDER — ALBUTEROL SULFATE HFA 108 (90 BASE) MCG/ACT IN AERS: 2.0000 | INHALATION_SPRAY | Freq: Four times a day (QID) | RESPIRATORY_TRACT | Status: DC | PRN

## 2012-03-12 MED ORDER — METHYLPREDNISOLONE ACETATE 80 MG/ML IJ SUSP
120.0000 mg | Freq: Once | INTRAMUSCULAR | Status: AC
  Administered 2012-03-12: 120 mg via INTRAMUSCULAR

## 2012-03-12 NOTE — Progress Notes (Signed)
Subjective:    Patient ID: [name], male    DOB: 10/03/1940, 71 y.o.   MRN: [id number]  Cough This is a new problem. The current episode started in the past 7 days. The problem has been gradually worsening. The problem occurs every few hours. The cough is productive of purulent sputum. Associated symptoms include chills, shortness of breath and wheezing. Pertinent negatives include no chest pain, ear congestion, ear pain, fever, headaches, heartburn, myalgias, nasal congestion, postnasal drip, rash, sore throat, sweats or weight loss. The symptoms are aggravated by cold air. He has tried OTC cough suppressant, steroid inhaler and a beta-agonist inhaler for the symptoms. The treatment provided mild relief. His past medical history is significant for COPD.      Review of Systems  Constitutional: Positive for chills. Negative for fever, weight loss, diaphoresis, activity change, appetite change, fatigue and unexpected weight change.  HENT: Negative.  Negative for ear pain, sore throat and postnasal drip.   Eyes: Negative.   Respiratory: Positive for cough, shortness of breath and wheezing. Negative for apnea, choking, chest tightness and stridor.   Cardiovascular: Negative for chest pain, palpitations and leg swelling.  Gastrointestinal: Negative for heartburn, nausea, vomiting, abdominal pain, diarrhea, constipation and blood in stool.  Genitourinary: Negative.   Musculoskeletal: Negative.  Negative for myalgias.  Skin: Negative for color change, pallor, rash and wound.  Neurological: Negative.  Negative for headaches.  Hematological: Negative for adenopathy. Does not bruise/bleed easily.  Psychiatric/Behavioral: Negative.        Objective:   Physical Exam  Vitals reviewed. Constitutional: He is oriented to person, place, and time. He appears well-developed and well-nourished.  Non-toxic appearance. He does not have a sickly appearance. He does not appear ill. No distress.  HENT:    Head: Normocephalic and atraumatic.  Mouth/Throat: Oropharynx is clear and moist. No oropharyngeal exudate.  Eyes: Conjunctivae normal are normal. Right eye exhibits no discharge. Left eye exhibits no discharge. No scleral icterus.  Neck: Normal range of motion. Neck supple. No JVD present. No tracheal deviation present. No thyromegaly present.  Cardiovascular: Normal rate, regular rhythm, normal heart sounds and intact distal pulses.  Exam reveals no gallop and no friction rub.   No murmur heard. Pulmonary/Chest: No accessory muscle usage or stridor. Not tachypneic. He is in respiratory distress. He has no decreased breath sounds. He has wheezes in the right upper field, the right middle field, the right lower field, the left upper field, the left middle field and the left lower field. He has rhonchi in the right upper field, the right middle field, the right lower field, the left upper field, the left middle field and the left lower field. He has no rales. He exhibits no tenderness.       He received an injection of depo-medrol IM and an inhalation treatment with brovana, after that was completed his air movement has improved and the wheezes and rhonchi have decreased by about 50%  Abdominal: Soft. Bowel sounds are normal. He exhibits no distension and no mass. There is no tenderness. There is no rebound and no guarding.  Musculoskeletal: Normal range of motion. He exhibits no edema and no tenderness.  Lymphadenopathy:    He has no cervical adenopathy.  Neurological: He is oriented to person, place, and time.  Skin: Skin is warm and dry. No rash noted. He is not diaphoretic. No erythema. No pallor.  Psychiatric: He has a normal mood and affect. His behavior is normal. Judgment and thought  content normal.     Lab Results  Component Value Date   WBC 6.5 12/19/2010   HGB 16.4 12/19/2010   HCT 47.0 12/19/2010   PLT 184.0 12/19/2010   GLUCOSE 97 06/07/2011   CHOL 163 06/07/2011   TRIG 102.0  06/07/2011   HDL 65.50 06/07/2011   LDLCALC 77 06/07/2011   ALT 31 06/07/2011   AST 23 06/07/2011   NA 140 06/07/2011   K 3.9 06/07/2011   CL 104 06/07/2011   CREATININE 0.9 06/07/2011   BUN 13 06/07/2011   CO2 30 06/07/2011   TSH 1.70 06/07/2011   PSA 1.03 12/19/2010       Assessment & Plan:

## 2012-03-12 NOTE — Patient Instructions (Signed)

## 2012-03-14 ENCOUNTER — Encounter: Payer: Self-pay | Admitting: Internal Medicine

## 2012-03-14 NOTE — Assessment & Plan Note (Signed)
I will check his CXR to see if he has PNA, mass, edema 

## 2012-03-14 NOTE — Assessment & Plan Note (Signed)
Steroids and antibiotics were given, I will recheck him soon

## 2012-03-15 ENCOUNTER — Ambulatory Visit (INDEPENDENT_AMBULATORY_CARE_PROVIDER_SITE_OTHER): Payer: Medicare Other | Admitting: Internal Medicine

## 2012-03-15 ENCOUNTER — Encounter: Payer: Self-pay | Admitting: Internal Medicine

## 2012-03-15 ENCOUNTER — Other Ambulatory Visit (INDEPENDENT_AMBULATORY_CARE_PROVIDER_SITE_OTHER): Payer: Medicare Other

## 2012-03-15 VITALS — BP 128/70 | HR 82 | Temp 97.8°F | Resp 16 | Wt 232.0 lb

## 2012-03-15 DIAGNOSIS — E876 Hypokalemia: Secondary | ICD-10-CM

## 2012-03-15 DIAGNOSIS — I10 Essential (primary) hypertension: Secondary | ICD-10-CM

## 2012-03-15 DIAGNOSIS — J449 Chronic obstructive pulmonary disease, unspecified: Secondary | ICD-10-CM

## 2012-03-15 LAB — BASIC METABOLIC PANEL
Chloride: 97 mEq/L (ref 96–112)
Potassium: 3.2 mEq/L — ABNORMAL LOW (ref 3.5–5.1)

## 2012-03-15 MED ORDER — FLUTICASONE-SALMETEROL 500-50 MCG/DOSE IN AEPB: 1.0000 | INHALATION_SPRAY | Freq: Two times a day (BID) | RESPIRATORY_TRACT | Status: DC

## 2012-03-15 MED ORDER — POTASSIUM CHLORIDE CRYS ER 20 MEQ PO TBCR: 20.0000 meq | EXTENDED_RELEASE_TABLET | Freq: Three times a day (TID) | ORAL | Status: DC

## 2012-03-15 NOTE — Patient Instructions (Signed)

## 2012-03-15 NOTE — Progress Notes (Signed)
  Subjective:    Patient ID: [name], male    DOB: May 29, 1940, 71 y.o.   MRN: [id number]  Hypertension This is a chronic problem. The current episode started more than 1 year ago. The problem has been gradually improving since onset. The problem is controlled. Pertinent negatives include no anxiety, blurred vision, chest pain, headaches, malaise/fatigue, neck pain, orthopnea, palpitations, peripheral edema, PND, shortness of breath or sweats. There are no associated agents to hypertension. Past treatments include diuretics. The current treatment provides moderate improvement. Compliance problems include exercise and diet.       Review of Systems  Constitutional: Negative for fever, chills, malaise/fatigue, diaphoresis, activity change, appetite change, fatigue and unexpected weight change.  HENT: Negative.  Negative for neck pain.   Eyes: Negative.  Negative for blurred vision.  Respiratory: Positive for cough (much better today). Negative for apnea, choking, chest tightness, shortness of breath, wheezing and stridor.   Cardiovascular: Negative for chest pain, palpitations, orthopnea, leg swelling and PND.  Gastrointestinal: Negative.   Genitourinary: Negative.   Musculoskeletal: Negative.   Skin: Negative.   Neurological: Negative.  Negative for headaches.  Hematological: Negative for adenopathy. Does not bruise/bleed easily.  Psychiatric/Behavioral: Negative.        Objective:   Physical Exam  Vitals reviewed. Constitutional: He is oriented to person, place, and time. He appears well-developed and well-nourished.  Non-toxic appearance. He does not have a sickly appearance. He does not appear ill. No distress.  HENT:  Head: Normocephalic and atraumatic.  Mouth/Throat: Oropharynx is clear and moist. No oropharyngeal exudate.  Eyes: Conjunctivae normal are normal. Right eye exhibits no discharge. Left eye exhibits no discharge. No scleral icterus.  Neck: Normal range of motion.  Neck supple. No JVD present. No tracheal deviation present. No thyromegaly present.  Cardiovascular: Normal rate, regular rhythm, normal heart sounds and intact distal pulses.  Exam reveals no gallop and no friction rub.   No murmur heard. Pulmonary/Chest: Effort normal and breath sounds normal. No accessory muscle usage or stridor. Not tachypneic. No respiratory distress. He has no decreased breath sounds. He has no wheezes. He has no rhonchi. He has no rales. He exhibits no tenderness.  Abdominal: Soft. Bowel sounds are normal. He exhibits no distension and no mass. There is no tenderness. There is no rebound and no guarding.  Musculoskeletal: Normal range of motion. He exhibits no edema and no tenderness.  Lymphadenopathy:    He has no cervical adenopathy.  Neurological: He is oriented to person, place, and time.  Skin: Skin is warm and dry. No rash noted. He is not diaphoretic. No erythema. No pallor.  Psychiatric: He has a normal mood and affect. His behavior is normal. Judgment and thought content normal.     Lab Results  Component Value Date   WBC 6.5 12/19/2010   HGB 16.4 12/19/2010   HCT 47.0 12/19/2010   PLT 184.0 12/19/2010   GLUCOSE 97 06/07/2011   CHOL 163 06/07/2011   TRIG 102.0 06/07/2011   HDL 65.50 06/07/2011   LDLCALC 77 06/07/2011   ALT 31 06/07/2011   AST 23 06/07/2011   NA 140 06/07/2011   K 3.9 06/07/2011   CL 104 06/07/2011   CREATININE 0.9 06/07/2011   BUN 13 06/07/2011   CO2 30 06/07/2011   TSH 1.70 06/07/2011   PSA 1.03 12/19/2010       Assessment & Plan:

## 2012-03-15 NOTE — Assessment & Plan Note (Signed)
Labs today show a low K+ He will start K+ replacement

## 2012-03-15 NOTE — Assessment & Plan Note (Signed)
He is much improved Will increase the advair strength He will continue the other meds

## 2012-03-15 NOTE — Assessment & Plan Note (Signed)
His BP is well controlled I will check his lytes and renal function today 

## 2012-05-31 ENCOUNTER — Other Ambulatory Visit: Payer: Self-pay | Admitting: Internal Medicine

## 2012-05-31 DIAGNOSIS — J449 Chronic obstructive pulmonary disease, unspecified: Secondary | ICD-10-CM

## 2012-05-31 MED ORDER — BUDESONIDE-FORMOTEROL FUMARATE 160-4.5 MCG/ACT IN AERO: 2.0000 | INHALATION_SPRAY | Freq: Two times a day (BID) | RESPIRATORY_TRACT | Status: DC

## 2012-07-08 ENCOUNTER — Encounter: Payer: Self-pay | Admitting: Internal Medicine

## 2012-07-08 ENCOUNTER — Other Ambulatory Visit (INDEPENDENT_AMBULATORY_CARE_PROVIDER_SITE_OTHER): Payer: Medicare Other

## 2012-07-08 ENCOUNTER — Ambulatory Visit (INDEPENDENT_AMBULATORY_CARE_PROVIDER_SITE_OTHER): Payer: Medicare Other | Admitting: Internal Medicine

## 2012-07-08 VITALS — BP 148/82 | HR 86 | Temp 95.9°F | Resp 12 | Wt 239.0 lb

## 2012-07-08 DIAGNOSIS — N401 Enlarged prostate with lower urinary tract symptoms: Secondary | ICD-10-CM

## 2012-07-08 DIAGNOSIS — R7309 Other abnormal glucose: Secondary | ICD-10-CM

## 2012-07-08 DIAGNOSIS — I10 Essential (primary) hypertension: Secondary | ICD-10-CM

## 2012-07-08 DIAGNOSIS — E785 Hyperlipidemia, unspecified: Secondary | ICD-10-CM

## 2012-07-08 DIAGNOSIS — R739 Hyperglycemia, unspecified: Secondary | ICD-10-CM | POA: Insufficient documentation

## 2012-07-08 DIAGNOSIS — E876 Hypokalemia: Secondary | ICD-10-CM

## 2012-07-08 DIAGNOSIS — Z0000 Encounter for general adult medical examination without abnormal findings: Secondary | ICD-10-CM

## 2012-07-08 DIAGNOSIS — R609 Edema, unspecified: Secondary | ICD-10-CM | POA: Insufficient documentation

## 2012-07-08 DIAGNOSIS — R9431 Abnormal electrocardiogram [ECG] [EKG]: Secondary | ICD-10-CM

## 2012-07-08 DIAGNOSIS — N138 Other obstructive and reflux uropathy: Secondary | ICD-10-CM

## 2012-07-08 LAB — URINALYSIS, ROUTINE W REFLEX MICROSCOPIC
Leukocytes, UA: NEGATIVE
Nitrite: NEGATIVE
Specific Gravity, Urine: 1.02 (ref 1.000–1.030)
pH: 7 (ref 5.0–8.0)

## 2012-07-08 LAB — LIPID PANEL
Cholesterol: 151 mg/dL (ref 0–200)
LDL Cholesterol: 82 mg/dL (ref 0–99)
Total CHOL/HDL Ratio: 3

## 2012-07-08 LAB — CBC WITH DIFFERENTIAL/PLATELET
Basophils Absolute: 0 10*3/uL (ref 0.0–0.1)
Eosinophils Relative: 2.1 % (ref 0.0–5.0)
MCV: 94.3 fl (ref 78.0–100.0)
Monocytes Absolute: 0.5 10*3/uL (ref 0.1–1.0)
Monocytes Relative: 6.9 % (ref 3.0–12.0)
Neutrophils Relative %: 72.2 % (ref 43.0–77.0)
Platelets: 188 10*3/uL (ref 150.0–400.0)
WBC: 7.2 10*3/uL (ref 4.5–10.5)

## 2012-07-08 LAB — COMPREHENSIVE METABOLIC PANEL
ALT: 30 U/L (ref 0–53)
Albumin: 4.2 g/dL (ref 3.5–5.2)
Alkaline Phosphatase: 45 U/L (ref 39–117)
Glucose, Bld: 91 mg/dL (ref 70–99)
Potassium: 3.8 mEq/L (ref 3.5–5.1)
Sodium: 137 mEq/L (ref 135–145)
Total Protein: 6.6 g/dL (ref 6.0–8.3)

## 2012-07-08 LAB — PSA: PSA: 1.06 ng/mL (ref 0.10–4.00)

## 2012-07-08 LAB — MAGNESIUM: Magnesium: 1.9 mg/dL (ref 1.5–2.5)

## 2012-07-08 LAB — TSH: TSH: 1.27 u[IU]/mL (ref 0.35–5.50)

## 2012-07-08 LAB — FECAL OCCULT BLOOD, GUAIAC: Fecal Occult Blood: NEGATIVE

## 2012-07-08 LAB — HEMOGLOBIN A1C: Hgb A1c MFr Bld: 5.4 % (ref 4.6–6.5)

## 2012-07-08 MED ORDER — OLMESARTAN MEDOXOMIL 40 MG PO TABS: 40.0000 mg | ORAL_TABLET | Freq: Every day | ORAL | Status: DC

## 2012-07-08 NOTE — Assessment & Plan Note (Signed)
I will recheck his K+ and Mg++ levels today 

## 2012-07-08 NOTE — Patient Instructions (Signed)
Health Maintenance, Males A healthy lifestyle and preventative care can promote health and wellness.  Maintain regular health, dental, and eye exams.  Eat a healthy diet. Foods like vegetables, fruits, whole grains, low-fat dairy products, and lean protein foods contain the nutrients you need without too many calories. Decrease your intake of foods high in solid fats, added sugars, and salt. Get information about a proper diet from your caregiver, if necessary.  Regular physical exercise is one of the most important things you can do for your health. Most adults should get at least 150 minutes of moderate-intensity exercise (any activity that increases your heart rate and causes you to sweat) each week. In addition, most adults need muscle-strengthening exercises on 2 or more days a week.   Maintain a healthy weight. The body mass index (BMI) is a screening tool to identify possible weight problems. It provides an estimate of body fat based on height and weight. Your caregiver can help determine your BMI, and can help you achieve or maintain a healthy weight. For adults 20 years and older:  A BMI below 18.5 is considered underweight.  A BMI of 18.5 to 24.9 is normal.  A BMI of 25 to 29.9 is considered overweight.  A BMI of 30 and above is considered obese.  Maintain normal blood lipids and cholesterol by exercising and minimizing your intake of saturated fat. Eat a balanced diet with plenty of fruits and vegetables. Blood tests for lipids and cholesterol should begin at age 20 and be repeated every 5 years. If your lipid or cholesterol levels are high, you are over 50, or you are a high risk for heart disease, you may need your cholesterol levels checked more frequently.Ongoing high lipid and cholesterol levels should be treated with medicines, if diet and exercise are not effective.  If you smoke, find out from your caregiver how to quit. If you do not use tobacco, do not start.  If you  choose to drink alcohol, do not exceed 2 drinks per day. One drink is considered to be 12 ounces (355 mL) of beer, 5 ounces (148 mL) of wine, or 1.5 ounces (44 mL) of liquor.  Avoid use of street drugs. Do not share needles with anyone. Ask for help if you need support or instructions about stopping the use of drugs.  High blood pressure causes heart disease and increases the risk of stroke. Blood pressure should be checked at least every 1 to 2 years. Ongoing high blood pressure should be treated with medicines if weight loss and exercise are not effective.  If you are 45 to 73 years old, ask your caregiver if you should take aspirin to prevent heart disease.  Diabetes screening involves taking a blood sample to check your fasting blood sugar level. This should be done once every 3 years, after age 45, if you are within normal weight and without risk factors for diabetes. Testing should be considered at a younger age or be carried out more frequently if you are overweight and have at least 1 risk factor for diabetes.  Colorectal cancer can be detected and often prevented. Most routine colorectal cancer screening begins at the age of 50 and continues through age 75. However, your caregiver may recommend screening at an earlier age if you have risk factors for colon cancer. On a yearly basis, your caregiver may provide home test kits to check for hidden blood in the stool. Use of a small camera at the end of a tube,   to directly examine the colon (sigmoidoscopy or colonoscopy), can detect the earliest forms of colorectal cancer. Talk to your caregiver about this at age 50, when routine screening begins. Direct examination of the colon should be repeated every 5 to 10 years through age 75, unless early forms of pre-cancerous polyps or small growths are found.  Hepatitis C blood testing is recommended for all people born from 1945 through 1965 and any individual with known risks for hepatitis C.  Healthy  men should no longer receive prostate-specific antigen (PSA) blood tests as part of routine cancer screening. Consult with your caregiver about prostate cancer screening.  Testicular cancer screening is not recommended for adolescents or adult males who have no symptoms. Screening includes self-exam, caregiver exam, and other screening tests. Consult with your caregiver about any symptoms you have or any concerns you have about testicular cancer.  Practice safe sex. Use condoms and avoid high-risk sexual practices to reduce the spread of sexually transmitted infections (STIs).  Use sunscreen with a sun protection factor (SPF) of 30 or greater. Apply sunscreen liberally and repeatedly throughout the day. You should seek shade when your shadow is shorter than you. Protect yourself by wearing long sleeves, pants, a wide-brimmed hat, and sunglasses year round, whenever you are outdoors.  Notify your caregiver of new moles or changes in moles, especially if there is a change in shape or color. Also notify your caregiver if a mole is larger than the size of a pencil eraser.  A one-time screening for abdominal aortic aneurysm (AAA) and surgical repair of large AAAs by sound wave imaging (ultrasonography) is recommended for ages 65 to 75 years who are current or former smokers.  Stay current with your immunizations. Document Released: 10/07/2007 Document Revised: 07/03/2011 Document Reviewed: 09/05/2010 [employer] Patient Information 2013 [employer]. Hypertension As your heart beats, it forces blood through your arteries. This force is your blood pressure. If the pressure is too high, it is called hypertension (HTN) or high blood pressure. HTN is dangerous because you may have it and not know it. High blood pressure may mean that your heart has to work harder to pump blood. Your arteries may be narrow or stiff. The extra work puts you at risk for heart disease, stroke, and other problems.  Blood pressure  consists of two numbers, a higher number over a lower, 110/72, for example. It is stated as "110 over 72." The ideal is below 120 for the top number (systolic) and under 80 for the bottom (diastolic). Write down your blood pressure today. You should pay close attention to your blood pressure if you have certain conditions such as:  Heart failure.  Prior heart attack.  Diabetes  Chronic kidney disease.  Prior stroke.  Multiple risk factors for heart disease. To see if you have HTN, your blood pressure should be measured while you are seated with your arm held at the level of the heart. It should be measured at least twice. A one-time elevated blood pressure reading (especially in the Emergency Department) does not mean that you need treatment. There may be conditions in which the blood pressure is different between your right and left arms. It is important to see your caregiver soon for a recheck. Most people have essential hypertension which means that there is not a specific cause. This type of high blood pressure may be lowered by changing lifestyle factors such as:  Stress.  Smoking.  Lack of exercise.  Excessive weight.  Drug/tobacco/alcohol use.    Eating less salt. Most people do not have symptoms from high blood pressure until it has caused damage to the body. Effective treatment can often prevent, delay or reduce that damage. TREATMENT  When a cause has been identified, treatment for high blood pressure is directed at the cause. There are a large number of medications to treat HTN. These fall into several categories, and your caregiver will help you select the medicines that are best for you. Medications may have side effects. You should review side effects with your caregiver. If your blood pressure stays high after you have made lifestyle changes or started on medicines,   Your medication(s) may need to be changed.  Other problems may need to be addressed.  Be certain you  understand your prescriptions, and know how and when to take your medicine.  Be sure to follow up with your caregiver within the time frame advised (usually within two weeks) to have your blood pressure rechecked and to review your medications.  If you are taking more than one medicine to lower your blood pressure, make sure you know how and at what times they should be taken. Taking two medicines at the same time can result in blood pressure that is too low. SEEK IMMEDIATE MEDICAL CARE IF:  You develop a severe headache, blurred or changing vision, or confusion.  You have unusual weakness or numbness, or a faint feeling.  You have severe chest or abdominal pain, vomiting, or breathing problems. MAKE SURE YOU:   Understand these instructions.  Will watch your condition.  Will get help right away if you are not doing well or get worse. Document Released: 04/10/2005 Document Revised: 07/03/2011 Document Reviewed: 11/29/2007 [employer] Patient Information 2013 [employer].  

## 2012-07-08 NOTE — Assessment & Plan Note (Signed)
I will check his A1C to see if he has developed DM II 

## 2012-07-08 NOTE — Assessment & Plan Note (Signed)
EKG today shows some loss of voltage in III and aVF, this was seen in the EKG of 2011 but it appears more pronounced today. He has had some SOB and edema so I have asked him to see cardiology for further evaluation.

## 2012-07-08 NOTE — Assessment & Plan Note (Signed)
FLP today 

## 2012-07-08 NOTE — Progress Notes (Signed)
Subjective:    Patient ID: [name], male    DOB: 25-Jun-1940, 72 y.o.   MRN: [id number]  Hypertension This is a chronic problem. The current episode started more than 1 year ago. The problem has been gradually worsening since onset. The problem is controlled. Associated symptoms include peripheral edema and shortness of breath. Pertinent negatives include no anxiety, blurred vision, chest pain, headaches, malaise/fatigue, neck pain, orthopnea, palpitations, PND or sweats. There are no associated agents to hypertension. Risk factors for coronary artery disease include smoking/tobacco exposure, male gender and obesity. Past treatments include diuretics. The current treatment provides moderate improvement. Compliance problems include exercise and diet.       Review of Systems  Constitutional: Negative for fever, chills, malaise/fatigue, diaphoresis, activity change, appetite change, fatigue and unexpected weight change.  HENT: Negative.  Negative for neck pain.   Eyes: Negative.  Negative for blurred vision.  Respiratory: Positive for shortness of breath. Negative for apnea, cough, choking, chest tightness, wheezing and stridor.   Cardiovascular: Negative for chest pain, palpitations, orthopnea, leg swelling and PND.  Gastrointestinal: Negative for nausea, vomiting, abdominal pain, diarrhea, constipation and blood in stool.  Endocrine: Negative.   Genitourinary: Negative.  Negative for urgency, frequency, hematuria, flank pain, decreased urine volume, enuresis, difficulty urinating and genital sores.  Musculoskeletal: Negative for myalgias, back pain, joint swelling, arthralgias and gait problem.  Skin: Negative for color change, pallor, rash and wound.  Allergic/Immunologic: Negative.   Neurological: Negative for dizziness, tremors, light-headedness, numbness and headaches.  Hematological: Negative for adenopathy. Does not bruise/bleed easily.  Psychiatric/Behavioral: Negative.         Objective:   Physical Exam  Vitals reviewed. Constitutional: He is oriented to person, place, and time. He appears well-developed and well-nourished. No distress.  HENT:  Head: Normocephalic and atraumatic.  Mouth/Throat: Oropharynx is clear and moist. No oropharyngeal exudate.  Eyes: Conjunctivae are normal. Right eye exhibits no discharge. Left eye exhibits no discharge. No scleral icterus.  Neck: Normal range of motion. Neck supple. No JVD present. No tracheal deviation present. No thyromegaly present.  Cardiovascular: Normal rate, regular rhythm, normal heart sounds and intact distal pulses.  Exam reveals no gallop and no friction rub.   No murmur heard. Pulmonary/Chest: Effort normal and breath sounds normal. No stridor. No respiratory distress. He has no wheezes. He has no rales. He exhibits no tenderness.  Abdominal: Soft. Bowel sounds are normal. He exhibits no distension and no mass. There is no tenderness. There is no rebound and no guarding. Hernia confirmed negative in the right inguinal area and confirmed negative in the left inguinal area.  Genitourinary: Rectum normal, testes normal and penis normal. Rectal exam shows no external hemorrhoid, no internal hemorrhoid, no fissure, no mass, no tenderness and anal tone normal. Guaiac negative stool. Prostate is enlarged (1+ smooth symm BPH). Prostate is not tender. Right testis shows no mass, no swelling and no tenderness. Right testis is descended. Left testis shows no mass, no swelling and no tenderness. Left testis is descended. Circumcised. No penile erythema or penile tenderness. No discharge found.  Musculoskeletal: Normal range of motion. He exhibits edema (1+ edema in BLE). He exhibits no tenderness.  Lymphadenopathy:    He has no cervical adenopathy.       Right: No inguinal adenopathy present.       Left: No inguinal adenopathy present.  Neurological: He is oriented to person, place, and time.  Skin: Skin is warm and dry. No  rash noted. He  is not diaphoretic. No erythema. No pallor.  Psychiatric: He has a normal mood and affect. His behavior is normal. Judgment and thought content normal.     Lab Results  Component Value Date   WBC 6.5 12/19/2010   HGB 16.4 12/19/2010   HCT 47.0 12/19/2010   PLT 184.0 12/19/2010   GLUCOSE 105* 03/15/2012   CHOL 163 06/07/2011   TRIG 102.0 06/07/2011   HDL 65.50 06/07/2011   LDLCALC 77 06/07/2011   ALT 31 06/07/2011   AST 23 06/07/2011   NA 136 03/15/2012   K 3.2* 03/15/2012   CL 97 03/15/2012   CREATININE 1.1 03/15/2012   BUN 20 03/15/2012   CO2 32 03/15/2012   TSH 1.70 06/07/2011   PSA 1.03 12/19/2010       Assessment & Plan:

## 2012-07-08 NOTE — Assessment & Plan Note (Addendum)
I will check his labs today to look for secondary causes He will decrease his Na+ intake

## 2012-07-08 NOTE — Assessment & Plan Note (Addendum)

## 2012-07-08 NOTE — Assessment & Plan Note (Signed)
His BP is not well controlled, I am concerned that his renin has increased due to the diuretic therapy He will add benicar to chlorthalidone Today I will check his lytes and renal function

## 2012-07-08 NOTE — Assessment & Plan Note (Signed)
I will check his PSA today 

## 2012-07-23 ENCOUNTER — Telehealth: Payer: Self-pay | Admitting: Internal Medicine

## 2012-07-23 DIAGNOSIS — R9431 Abnormal electrocardiogram [ECG] [EKG]: Secondary | ICD-10-CM

## 2012-07-23 NOTE — Telephone Encounter (Signed)
[name] called because he thought he was to be referred to Cardiology.  He would like that referral.  He has a follow up appt on April 28.

## 2012-07-24 NOTE — Telephone Encounter (Signed)
He was referred again

## 2012-07-24 NOTE — Telephone Encounter (Signed)
Pt is aware.  

## 2012-07-25 ENCOUNTER — Other Ambulatory Visit: Payer: Self-pay | Admitting: Internal Medicine

## 2012-07-26 ENCOUNTER — Ambulatory Visit (INDEPENDENT_AMBULATORY_CARE_PROVIDER_SITE_OTHER): Payer: Medicare Other | Admitting: Cardiovascular Disease

## 2012-07-26 ENCOUNTER — Encounter: Payer: Self-pay | Admitting: Cardiovascular Disease

## 2012-07-26 VITALS — BP 170/84 | HR 76 | Ht 73.75 in | Wt 239.0 lb

## 2012-07-26 DIAGNOSIS — F17200 Nicotine dependence, unspecified, uncomplicated: Secondary | ICD-10-CM

## 2012-07-26 DIAGNOSIS — R9431 Abnormal electrocardiogram [ECG] [EKG]: Secondary | ICD-10-CM

## 2012-07-26 DIAGNOSIS — I10 Essential (primary) hypertension: Secondary | ICD-10-CM

## 2012-07-26 DIAGNOSIS — R609 Edema, unspecified: Secondary | ICD-10-CM

## 2012-07-26 NOTE — Assessment & Plan Note (Signed)
His blood pressure is elevated today. He admits to eating some salty foods at the past couple days. He states that his blood pressure has been as high and in fact was elevated when he saw Dr. Yetta Barre last month. He did not want to follow up with me for his hypertension so we will not start any new medications. I've given him some recommendations on the DASH diet.  Have asked him to try to exercise as much as possible.
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
## 2012-07-26 NOTE — Assessment & Plan Note (Signed)
[name] presents today for further evaluation of what was interpreted as an abnormal EKG. By my reading EKG from March 17 was normal. It was thought that he may have some low voltage but this is certainly within normal limits.  He has symptoms consistent with COPD. It's quite possible that he has some right ventricular conduction delay associated with his COPD but this would be expected given his history.  He denies any episodes of angina. We did discuss the fact that he continues to smoke and is at risk for having coronary artery disease or strokes. In addition, his blood pressure is elevated.  At this point he would like to followup with Dr. Yetta Barre. He'll see me on an as-needed basis. No further testing is indicated at this time.  I would be happy to see him in the future if he should develop any chest pain or other cardiac symptoms. We also could consider doing an echocardiogram if he develops any worsening symptoms that may be due to congestive heart failure.

## 2012-07-26 NOTE — Patient Instructions (Addendum)
Your physician recommends that you schedule a follow-up appointment in: as needed basis.  REDUCE HIGH SODIUM FOODS LIKE CANNED SOUP, GRAVY, SAUCES, READY PREPARED FOODS LIKE FROZEN FOODS; LEAN CUISINE, LASAGNA. BACON, SAUSAGE, LUNCH MEAT, FAST FOODS.Marland Kitchen Hotdogs and chips  DASH Diet The DASH diet stands for "Dietary Approaches to Stop Hypertension." It is a healthy eating plan that has been shown to reduce high blood pressure (hypertension) in as little as 14 days, while also possibly providing other significant health benefits. These other health benefits include reducing the risk of breast cancer after menopause and reducing the risk of type 2 diabetes, heart disease, colon cancer, and stroke. Health benefits also include weight loss and slowing kidney failure in patients with chronic kidney disease.  DIET GUIDELINES  Limit salt (sodium). Your diet should contain less than 1500 mg of sodium daily.  Limit refined or processed carbohydrates. Your diet should include mostly whole grains. Desserts and added sugars should be used sparingly.  Include small amounts of heart-healthy fats. These types of fats include nuts, oils, and tub margarine. Limit saturated and trans fats. These fats have been shown to be harmful in the body. CHOOSING FOODS  The following food groups are based on a 2000 calorie diet. See your Registered Dietitian for individual calorie needs. Grains and Grain Products (6 to 8 servings daily)  Eat More Often: Whole-wheat bread, brown rice, whole-grain or wheat pasta, quinoa, popcorn without added fat or salt (air popped).  Eat Less Often: White bread, white pasta, white rice, cornbread. Vegetables (4 to 5 servings daily)  Eat More Often: Fresh, frozen, and canned vegetables. Vegetables may be raw, steamed, roasted, or grilled with a minimal amount of fat.  Eat Less Often/Avoid: Creamed or fried vegetables. Vegetables in a cheese sauce. Fruit (4 to 5 servings daily)  Eat More  Often: All fresh, canned (in natural juice), or frozen fruits. Dried fruits without added sugar. One hundred percent fruit juice ( cup [237 mL] daily).  Eat Less Often: Dried fruits with added sugar. Canned fruit in light or heavy syrup. [employer], Fish, and Poultry (2 servings or less daily. One serving is 3 to 4 oz [85-114 g]).  Eat More Often: Ninety percent or leaner ground beef, tenderloin, sirloin. Round cuts of beef, chicken breast, [place] breast. All fish. Grill, bake, or broil your meat. Nothing should be fried.  Eat Less Often/Avoid: Fatty cuts of meat, [place], or chicken leg, thigh, or wing. Fried cuts of meat or fish. Dairy (2 to 3 servings)  Eat More Often: Low-fat or fat-free milk, low-fat plain or light yogurt, reduced-fat or part-skim cheese.  Eat Less Often/Avoid: Milk (whole, 2%).Whole milk yogurt. Full-fat cheeses. Nuts, Seeds, and Legumes (4 to 5 servings per week)  Eat More Often: All without added salt.  Eat Less Often/Avoid: Salted nuts and seeds, canned beans with added salt. Fats and Sweets (limited)  Eat More Often: Vegetable oils, tub margarines without trans fats, sugar-free gelatin. Mayonnaise and salad dressings.  Eat Less Often/Avoid: Coconut oils, palm oils, butter, stick margarine, cream, half and half, cookies, candy, pie. FOR MORE INFORMATION The Dash Diet Eating Plan: www.dashdiet.org Document Released: 03/30/2011 Document Revised: 07/03/2011 Document Reviewed: 03/30/2011 [facility] Patient Information 2013 [place], [place].

## 2012-07-26 NOTE — Assessment & Plan Note (Signed)
He has no edema will exam today. Given his COPD he would not be unusual for him to have mild leg edema related to emphysema. Salt restriction will help.

## 2012-08-08 ENCOUNTER — Emergency Department (HOSPITAL_COMMUNITY): Payer: Medicare Other

## 2012-08-08 ENCOUNTER — Encounter (HOSPITAL_COMMUNITY): Payer: Self-pay | Admitting: Emergency Medicine

## 2012-08-08 ENCOUNTER — Emergency Department (HOSPITAL_COMMUNITY)
Admission: EM | Admit: 2012-08-08 | Discharge: 2012-08-09 | Disposition: A | Discharge status: Home / Self Care | Payer: Medicare Other | Attending: Emergency Medicine | Admitting: Emergency Medicine

## 2012-08-08 DIAGNOSIS — Z85828 Personal history of other malignant neoplasm of skin: Secondary | ICD-10-CM | POA: Insufficient documentation

## 2012-08-08 DIAGNOSIS — I10 Essential (primary) hypertension: Secondary | ICD-10-CM | POA: Insufficient documentation

## 2012-08-08 DIAGNOSIS — F17200 Nicotine dependence, unspecified, uncomplicated: Secondary | ICD-10-CM | POA: Insufficient documentation

## 2012-08-08 DIAGNOSIS — N289 Disorder of kidney and ureter, unspecified: Secondary | ICD-10-CM | POA: Insufficient documentation

## 2012-08-08 DIAGNOSIS — Z8739 Personal history of other diseases of the musculoskeletal system and connective tissue: Secondary | ICD-10-CM | POA: Insufficient documentation

## 2012-08-08 DIAGNOSIS — E860 Dehydration: Secondary | ICD-10-CM | POA: Insufficient documentation

## 2012-08-08 DIAGNOSIS — Z79899 Other long term (current) drug therapy: Secondary | ICD-10-CM | POA: Insufficient documentation

## 2012-08-08 DIAGNOSIS — Z7982 Long term (current) use of aspirin: Secondary | ICD-10-CM | POA: Insufficient documentation

## 2012-08-08 DIAGNOSIS — Z8619 Personal history of other infectious and parasitic diseases: Secondary | ICD-10-CM | POA: Insufficient documentation

## 2012-08-08 DIAGNOSIS — E785 Hyperlipidemia, unspecified: Secondary | ICD-10-CM | POA: Insufficient documentation

## 2012-08-08 DIAGNOSIS — R5381 Other malaise: Secondary | ICD-10-CM | POA: Insufficient documentation

## 2012-08-08 DIAGNOSIS — R5383 Other fatigue: Secondary | ICD-10-CM | POA: Insufficient documentation

## 2012-08-08 DIAGNOSIS — J449 Chronic obstructive pulmonary disease, unspecified: Secondary | ICD-10-CM | POA: Insufficient documentation

## 2012-08-08 DIAGNOSIS — R42 Dizziness and giddiness: Secondary | ICD-10-CM | POA: Insufficient documentation

## 2012-08-08 DIAGNOSIS — J4489 Other specified chronic obstructive pulmonary disease: Secondary | ICD-10-CM | POA: Insufficient documentation

## 2012-08-08 LAB — CBC WITH DIFFERENTIAL/PLATELET
Basophils Absolute: 0 10*3/uL (ref 0.0–0.1)
Basophils Relative: 0 % (ref 0–1)
Eosinophils Absolute: 0.2 10*3/uL (ref 0.0–0.7)
Eosinophils Relative: 3 % (ref 0–5)
HCT: 41.7 % (ref 39.0–52.0)
Hemoglobin: 14.7 g/dL (ref 13.0–17.0)
Lymphocytes Relative: 26 % (ref 12–46)
Lymphs Abs: 2 10*3/uL (ref 0.7–4.0)
MCH: 32.7 pg (ref 26.0–34.0)
MCHC: 35.3 g/dL (ref 30.0–36.0)
MCV: 92.9 fL (ref 78.0–100.0)
Monocytes Absolute: 0.7 10*3/uL (ref 0.1–1.0)
Monocytes Relative: 9 % (ref 3–12)
Neutro Abs: 4.7 10*3/uL (ref 1.7–7.7)
Neutrophils Relative %: 62 % (ref 43–77)
Platelets: 168 10*3/uL (ref 150–400)
RBC: 4.49 MIL/uL (ref 4.22–5.81)
RDW: 12.6 % (ref 11.5–15.5)
WBC: 7.7 10*3/uL (ref 4.0–10.5)

## 2012-08-08 MED ORDER — IOHEXOL 300 MG/ML  SOLN
50.0000 mL | Freq: Once | INTRAMUSCULAR | Status: AC | PRN
  Administered 2012-08-08: 50 mL via ORAL

## 2012-08-08 MED ORDER — SODIUM CHLORIDE 0.9 % IV BOLUS (SEPSIS)
1000.0000 mL | Freq: Once | INTRAVENOUS | Status: AC
Start: 2012-08-08 — End: 2012-08-09
  Administered 2012-08-08: 1000 mL via INTRAVENOUS

## 2012-08-08 NOTE — ED Notes (Signed)
Pt states he started having abd pain this evening about 1730  Pt states the pain has been moving around on him  Pt states he has a hernia but this pain is different  Pt states he did experience some dizziness earlier in the afternoon and had it again when EMS came to his house   Pt denies N/V/D

## 2012-08-09 ENCOUNTER — Emergency Department (HOSPITAL_COMMUNITY): Payer: Medicare Other

## 2012-08-09 LAB — URINALYSIS, ROUTINE W REFLEX MICROSCOPIC
Bilirubin Urine: NEGATIVE
Glucose, UA: NEGATIVE mg/dL
Hgb urine dipstick: NEGATIVE
Ketones, ur: NEGATIVE mg/dL
Leukocytes, UA: NEGATIVE
Nitrite: NEGATIVE
Protein, ur: NEGATIVE mg/dL
Specific Gravity, Urine: 1.019 (ref 1.005–1.030)
Urobilinogen, UA: 1 mg/dL (ref 0.0–1.0)
pH: 6 (ref 5.0–8.0)

## 2012-08-09 LAB — BASIC METABOLIC PANEL
BUN: 28 mg/dL — ABNORMAL HIGH (ref 6–23)
Chloride: 102 mEq/L (ref 96–112)
Creatinine, Ser: 1.44 mg/dL — ABNORMAL HIGH (ref 0.50–1.35)
GFR calc Af Amer: 54 mL/min — ABNORMAL LOW (ref >90)
GFR calc non Af Amer: 47 mL/min — ABNORMAL LOW (ref >90)

## 2012-08-09 LAB — COMPREHENSIVE METABOLIC PANEL
ALT: 22 U/L (ref 0–53)
AST: 19 U/L (ref 0–37)
Albumin: 3.4 g/dL — ABNORMAL LOW (ref 3.5–5.2)
Alkaline Phosphatase: 37 U/L — ABNORMAL LOW (ref 39–117)
BUN: 31 mg/dL — ABNORMAL HIGH (ref 6–23)
CO2: 23 mEq/L (ref 19–32)
Calcium: 9.2 mg/dL (ref 8.4–10.5)
Chloride: 98 mEq/L (ref 96–112)
Creatinine, Ser: 2.09 mg/dL — ABNORMAL HIGH (ref 0.50–1.35)
GFR calc Af Amer: 34 mL/min — ABNORMAL LOW (ref >90)
GFR calc non Af Amer: 30 mL/min — ABNORMAL LOW (ref >90)
Glucose, Bld: 89 mg/dL (ref 70–99)
Potassium: 3.5 mEq/L (ref 3.5–5.1)
Sodium: 136 mEq/L (ref 135–145)
Total Bilirubin: 0.5 mg/dL (ref 0.3–1.2)
Total Protein: 5.7 g/dL — ABNORMAL LOW (ref 6.0–8.3)

## 2012-08-09 LAB — TROPONIN I: Troponin I: <0.3 ng/mL (ref <0.30)

## 2012-08-09 LAB — LIPASE, BLOOD: Lipase: 33 U/L (ref 11–59)

## 2012-08-09 MED ORDER — SODIUM CHLORIDE 0.9 % IV SOLN: 1000.0000 mL | Freq: Once | INTRAVENOUS | Status: DC

## 2012-08-09 MED ORDER — SODIUM CHLORIDE 0.9 % IV SOLN: 1000.0000 mL | INTRAVENOUS | Status: DC

## 2012-08-09 MED ORDER — SODIUM CHLORIDE 0.9 % IV BOLUS (SEPSIS)
1000.0000 mL | Freq: Once | INTRAVENOUS | Status: AC
  Administered 2012-08-09: 1000 mL via INTRAVENOUS

## 2012-08-09 NOTE — ED Notes (Signed)
Pt was able to ambulated to the bathroom without to dizziness, pt has a steady gait.

## 2012-08-09 NOTE — ED Provider Notes (Signed)
Medical screening examination/treatment/procedure(s) were conducted as a shared visit with non-physician practitioner(s) and myself.  I personally evaluated the patient during the encounter  The patient feels much better after IV fluids.  His initial BUN/creatinine was elevated however this is quickly improving with IV fluids on repeat BMP.  The patient be discharged home.  Instructed to followup with his primary care physician for recheck of his BUN and creatinine.  He understands to return to ER for new or worsening symptoms  1. Dehydration   2. Renal insufficiency    Results for orders placed during the hospital encounter of 08/08/12  CBC WITH DIFFERENTIAL      Result Value Range   WBC 7.7  4.0 - 10.5 K/uL   RBC 4.49  4.22 - 5.81 MIL/uL   Hemoglobin 14.7  13.0 - 17.0 g/dL   HCT 75.6  43.3 - 29.5 %   MCV 92.9  78.0 - 100.0 fL   MCH 32.7  26.0 - 34.0 pg   MCHC 35.3  30.0 - 36.0 g/dL   RDW 18.8  41.6 - 60.6 %   Platelets 168  150 - 400 K/uL   Neutrophils Relative 62  43 - 77 %   Neutro Abs 4.7  1.7 - 7.7 K/uL   Lymphocytes Relative 26  12 - 46 %   Lymphs Abs 2.0  0.7 - 4.0 K/uL   Monocytes Relative 9  3 - 12 %   Monocytes Absolute 0.7  0.1 - 1.0 K/uL   Eosinophils Relative 3  0 - 5 %   Eosinophils Absolute 0.2  0.0 - 0.7 K/uL   Basophils Relative 0  0 - 1 %   Basophils Absolute 0.0  0.0 - 0.1 K/uL  URINALYSIS, ROUTINE W REFLEX MICROSCOPIC      Result Value Range   Color, Urine YELLOW  YELLOW   APPearance CLEAR  CLEAR   Specific Gravity, Urine 1.019  1.005 - 1.030   pH 6.0  5.0 - 8.0   Glucose, UA NEGATIVE  NEGATIVE mg/dL   Hgb urine dipstick NEGATIVE  NEGATIVE   Bilirubin Urine NEGATIVE  NEGATIVE   Ketones, ur NEGATIVE  NEGATIVE mg/dL   Protein, ur NEGATIVE  NEGATIVE mg/dL   Urobilinogen, UA 1.0  0.0 - 1.0 mg/dL   Nitrite NEGATIVE  NEGATIVE   Leukocytes, UA NEGATIVE  NEGATIVE  COMPREHENSIVE METABOLIC PANEL      Result Value Range   Sodium 136  135 - 145 mEq/L   Potassium  3.5  3.5 - 5.1 mEq/L   Chloride 98  96 - 112 mEq/L   CO2 23  19 - 32 mEq/L   Glucose, Bld 89  70 - 99 mg/dL   BUN 31 (*) 6 - 23 mg/dL   Creatinine, Ser 3.01 (*) 0.50 - 1.35 mg/dL   Calcium 9.2  8.4 - 60.1 mg/dL   Total Protein 5.7 (*) 6.0 - 8.3 g/dL   Albumin 3.4 (*) 3.5 - 5.2 g/dL   AST 19  0 - 37 U/L   ALT 22  0 - 53 U/L   Alkaline Phosphatase 37 (*) 39 - 117 U/L   Total Bilirubin 0.5  0.3 - 1.2 mg/dL   GFR calc non Af Amer 30 (*) >90 mL/min   GFR calc Af Amer 34 (*) >90 mL/min  LIPASE, BLOOD      Result Value Range   Lipase 33  11 - 59 U/L  TROPONIN I      Result Value Range  Troponin I <0.30  <0.30 ng/mL  BASIC METABOLIC PANEL      Result Value Range   Sodium 135  135 - 145 mEq/L   Potassium 3.9  3.5 - 5.1 mEq/L   Chloride 102  96 - 112 mEq/L   CO2 25  19 - 32 mEq/L   Glucose, Bld 95  70 - 99 mg/dL   BUN 28 (*) 6 - 23 mg/dL   Creatinine, Ser 1.61 (*) 0.50 - 1.35 mg/dL   Calcium 8.6  8.4 - 09.6 mg/dL   GFR calc non Af Amer 47 (*) >90 mL/min   GFR calc Af Amer 54 (*) >90 mL/min     Lyanne Co, MD 08/09/12 [phone]

## 2012-08-09 NOTE — ED Provider Notes (Signed)
History     CSN: [id number]  Arrival date & time 08/08/12  2131   First MD Initiated Contact with Patient 08/08/12 2259      Chief Complaint  Patient presents with  . Abdominal Pain    (Consider location/radiation/quality/duration/timing/severity/associated sxs/prior treatment) HPI Patient presents emergency department with right lower abdominal pain, that began around 6 PM.  Patient, states, that he had been working in the yard and in the garage for most of the afternoon.  Patient, states he was feeling weak after doing these activities.  Patient denies chest pain, shortness of breath, nausea, vomiting, syncope, or dizziness.  Patient did state that he got lightheaded while in the garage working.  Patient did not take any medications prior to arrival.  Patient, states, that nothing seems to make his condition, better or worse.  Working outside most of the week without taking a lot of fluids. Past Medical History  Diagnosis Date  . Hypertension   . Hyperlipidemia   . COPD (chronic obstructive pulmonary disease)   . Osteoarthritis   . Skin cancer     history of: BCC on nose  . Genital warts 2009    Past Surgical History  Procedure Laterality Date  . Inguinal hernia repair    . Nasal sinus surgery    . Tonsillectomy    . Wart removal with grafts from scrotum (tannenbam) in 2009  2009    Family History  Problem Relation Age of Onset  . Adopted: Yes  . Alcohol abuse Neg Hx   . Cancer Neg Hx   . Heart disease Neg Hx   . Hyperlipidemia Neg Hx   . Hypertension Neg Hx   . Stroke Neg Hx     History  Substance Use Topics  . Smoking status: Current Every Day Smoker -- 1.00 packs/day for 50 years    Types: Cigarettes  . Smokeless tobacco: Never Used  . Alcohol Use: 2.4 oz/week    4 Cans of beer per week     Comment: 3-4 every other day      Review of Systems All other systems negative except as documented in the HPI. All pertinent positives and negatives as reviewed in  the HPI.  Allergies  Review of patient's allergies indicates no known allergies.  Home Medications   Current Outpatient Rx  Name  Route  Sig  Dispense  Refill  . albuterol (PROVENTIL HFA;VENTOLIN HFA) 108 (90 BASE) MCG/ACT inhaler   Inhalation   Inhale 2 puffs into the lungs every 6 (six) hours as needed.   1 Inhaler   11   . aspirin 81 MG tablet   Oral   Take 81 mg by mouth daily.           Marland Kitchen aspirin-sod bicarb-citric acid (ALKA-SELTZER) 325 MG TBEF   Oral   Take 325 mg by mouth every 6 (six) hours as needed (indigestion.).         Marland Kitchen atorvastatin (LIPITOR) 80 MG tablet      TAKE 1 TABLET BY MOUTH DAILY   90 tablet   3   . budesonide-formoterol (SYMBICORT) 160-4.5 MCG/ACT inhaler   Inhalation   Inhale 2 puffs into the lungs 2 (two) times daily.   3 Inhaler   3   . chlorthalidone (HYGROTON) 25 MG tablet      TAKE 1 TABLET BY MOUTH DAILY   90 tablet   1   . olmesartan (BENICAR) 40 MG tablet   Oral   Take 1  tablet (40 mg total) by mouth daily.   70 tablet   0   . potassium chloride SA (K-DUR,KLOR-CON) 20 MEQ tablet   Oral   Take 20 mEq by mouth 2 (two) times daily.         . simethicone (MYLICON) 80 MG chewable tablet   Oral   Chew 80 mg by mouth once.         . tiotropium (SPIRIVA HANDIHALER) 18 MCG inhalation capsule   Inhalation   Place 1 capsule (18 mcg total) into inhaler and inhale daily.   30 capsule   11     BP 100/54  Pulse 85  Temp(Src) 98.9 F (37.2 C) (Oral)  Resp 23  SpO2 93%  Physical Exam  Nursing note and vitals reviewed. Constitutional: He is oriented to person, place, and time. He appears well-developed and well-nourished. No distress.  HENT:  Head: Normocephalic and atraumatic.  Mouth/Throat: Oropharynx is clear and moist.  Eyes: Pupils are equal, round, and reactive to light.  Neck: Normal range of motion. Neck supple.  Cardiovascular: Normal rate, regular rhythm and normal heart sounds.  Exam reveals no gallop and  no friction rub.   No murmur heard. Pulmonary/Chest: Effort normal and breath sounds normal.  Abdominal: Soft. Bowel sounds are normal. He exhibits no distension. There is tenderness. There is no rigidity, no rebound, no guarding and no CVA tenderness. No hernia.    Neurological: He is alert and oriented to person, place, and time. He exhibits normal muscle tone. Coordination normal.  Skin: Skin is warm and dry. No rash noted. No erythema.    ED Course  Procedures (including critical care time)  Labs Reviewed  COMPREHENSIVE METABOLIC PANEL - Abnormal; Notable for the following:    BUN 31 (*)    Creatinine, Ser 2.09 (*)    Total Protein 5.7 (*)    Albumin 3.4 (*)    Alkaline Phosphatase 37 (*)    GFR calc non Af Amer 30 (*)    GFR calc Af Amer 34 (*)    All other components within normal limits  CBC WITH DIFFERENTIAL  LIPASE, BLOOD  TROPONIN I  URINALYSIS, ROUTINE W REFLEX MICROSCOPIC  Patient have evaluation for his abdominal pain.  Patient does state that he has some heaviness with his breathing.  1:45 AM.  Patient is feeling considerably better with the first round of IV fluids. his blood pressure has responded appropriately into the 140s systolic.  Patient, states he would like to go home.  Patient be given a second liter of fluids as he has renal impairment, most likely due to dehydration.  MDM  MDM Reviewed: nursing note, vitals and previous chart Interpretation: ECG, labs, x-ray and CT scan   Date: 08/09/2012  Rate: 69  Rhythm: normal sinus rhythm  QRS Axis: normal  Intervals: normal  ST/T Wave abnormalities: normal  Conduction Disutrbances:none  Narrative Interpretation:   Old EKG Reviewed: unchanged            Carlyle Dolly, PA-C 08/09/12 0211

## 2012-08-14 ENCOUNTER — Ambulatory Visit (INDEPENDENT_AMBULATORY_CARE_PROVIDER_SITE_OTHER): Payer: Medicare Other | Admitting: Internal Medicine

## 2012-08-14 ENCOUNTER — Other Ambulatory Visit (INDEPENDENT_AMBULATORY_CARE_PROVIDER_SITE_OTHER): Payer: Medicare Other

## 2012-08-14 ENCOUNTER — Encounter: Payer: Self-pay | Admitting: Internal Medicine

## 2012-08-14 VITALS — BP 130/70 | HR 72 | Temp 97.3°F | Resp 16 | Ht 74.0 in | Wt 240.0 lb

## 2012-08-14 DIAGNOSIS — I10 Essential (primary) hypertension: Secondary | ICD-10-CM

## 2012-08-14 DIAGNOSIS — N2889 Other specified disorders of kidney and ureter: Secondary | ICD-10-CM | POA: Insufficient documentation

## 2012-08-14 DIAGNOSIS — N182 Chronic kidney disease, stage 2 (mild): Secondary | ICD-10-CM

## 2012-08-14 LAB — BASIC METABOLIC PANEL
CO2: 31 mEq/L (ref 19–32)
Calcium: 9.8 mg/dL (ref 8.4–10.5)
Creatinine, Ser: 1.2 mg/dL (ref 0.4–1.5)

## 2012-08-14 NOTE — Progress Notes (Signed)
Subjective:    Patient ID: [name], male    DOB: 01-Sep-1940, 72 y.o.   MRN: [id number]  Hypertension This is a chronic problem. The current episode started more than 1 year ago. The problem is unchanged. The problem is controlled. Associated symptoms include peripheral edema. Pertinent negatives include no anxiety, blurred vision, chest pain, headaches, malaise/fatigue, neck pain, orthopnea, palpitations, PND, shortness of breath or sweats. Risk factors for coronary artery disease include male gender, obesity and smoking/tobacco exposure. Past treatments include angiotensin blockers and diuretics. The current treatment provides moderate improvement. Compliance problems include exercise and diet.       Review of Systems  Constitutional: Negative for fever, chills, malaise/fatigue, diaphoresis, activity change, appetite change, fatigue and unexpected weight change.  HENT: Negative.  Negative for neck pain.   Eyes: Negative.  Negative for blurred vision.  Respiratory: Negative.  Negative for cough, chest tightness, shortness of breath, wheezing and stridor.   Cardiovascular: Negative.  Negative for chest pain, palpitations, orthopnea, leg swelling and PND.  Gastrointestinal: Negative.  Negative for nausea, vomiting, abdominal pain, diarrhea and constipation.  Endocrine: Negative.   Genitourinary: Negative.  Negative for dysuria, frequency, hematuria and flank pain.  Musculoskeletal: Negative.  Negative for myalgias, back pain, joint swelling, arthralgias and gait problem.  Skin: Negative.  Negative for color change, pallor, rash and wound.  Allergic/Immunologic: Negative.   Neurological: Positive for dizziness, weakness and light-headedness. Negative for tremors, seizures, syncope, facial asymmetry, speech difficulty, numbness and headaches.  Hematological: Negative.  Negative for adenopathy. Does not bruise/bleed easily.  Psychiatric/Behavioral: Negative.        Objective:   Physical  Exam  Vitals reviewed. Constitutional: He is oriented to person, place, and time. He appears well-developed and well-nourished. No distress.  HENT:  Head: Normocephalic and atraumatic.  Mouth/Throat: Oropharynx is clear and moist. No oropharyngeal exudate.  Eyes: Conjunctivae are normal. Right eye exhibits no discharge. Left eye exhibits no discharge. No scleral icterus.  Neck: Normal range of motion. Neck supple. No JVD present. No tracheal deviation present. No thyromegaly present.  Cardiovascular: Normal rate, regular rhythm, normal heart sounds and intact distal pulses.  Exam reveals no gallop and no friction rub.   No murmur heard. Pulmonary/Chest: Effort normal and breath sounds normal. No stridor. No respiratory distress. He has no wheezes. He has no rales. He exhibits no tenderness.  Abdominal: Soft. Bowel sounds are normal. He exhibits no distension and no mass. There is no tenderness. There is no rebound and no guarding.  Musculoskeletal: Normal range of motion. He exhibits edema (1+ pitting edema in BLE). He exhibits no tenderness.  Lymphadenopathy:    He has no cervical adenopathy.  Neurological: He is oriented to person, place, and time.  Skin: Skin is warm and dry. No rash noted. He is not diaphoretic. No erythema. No pallor.  Psychiatric: He has a normal mood and affect. His behavior is normal. Judgment and thought content normal.     Lab Results  Component Value Date   WBC 7.7 08/08/2012   HGB 14.7 08/08/2012   HCT 41.7 08/08/2012   PLT 168 08/08/2012   GLUCOSE 95 08/09/2012   CHOL 151 07/08/2012   TRIG 78.0 07/08/2012   HDL 53.80 07/08/2012   LDLCALC 82 07/08/2012   ALT 22 08/08/2012   AST 19 08/08/2012   NA 135 08/09/2012   K 3.9 08/09/2012   CL 102 08/09/2012   CREATININE 1.44* 08/09/2012   BUN 28* 08/09/2012   CO2 25 08/09/2012  TSH 1.27 07/08/2012   PSA 1.06 07/08/2012   HGBA1C 5.4 07/08/2012       Assessment & Plan:

## 2012-08-14 NOTE — Patient Instructions (Signed)

## 2012-08-14 NOTE — Assessment & Plan Note (Signed)
This is caused by his age, HTN, diuretic therapy I will recheck his renal function today and will advise further if needed

## 2012-08-14 NOTE — Assessment & Plan Note (Signed)
He has adequate BP control but has developed some orthostatic symptoms He was educated about changing positions slowly and staying hydrated I will recheck his renal function today and if it continues to decline then I will lower the dose of his diuretic

## 2012-08-19 ENCOUNTER — Ambulatory Visit: Payer: Medicare Other | Admitting: Internal Medicine

## 2012-08-27 ENCOUNTER — Other Ambulatory Visit: Payer: Self-pay | Admitting: Internal Medicine

## 2012-09-12 ENCOUNTER — Encounter: Payer: Self-pay | Admitting: Internal Medicine

## 2012-09-12 ENCOUNTER — Ambulatory Visit (INDEPENDENT_AMBULATORY_CARE_PROVIDER_SITE_OTHER): Payer: Medicare Other | Admitting: Internal Medicine

## 2012-09-12 VITALS — BP 130/70 | HR 82 | Temp 98.1°F | Resp 16 | Wt 233.2 lb

## 2012-09-12 DIAGNOSIS — I10 Essential (primary) hypertension: Secondary | ICD-10-CM

## 2012-09-12 DIAGNOSIS — I129 Hypertensive chronic kidney disease with stage 1 through stage 4 chronic kidney disease, or unspecified chronic kidney disease: Secondary | ICD-10-CM

## 2012-09-12 DIAGNOSIS — N182 Chronic kidney disease, stage 2 (mild): Secondary | ICD-10-CM

## 2012-09-12 NOTE — Progress Notes (Signed)
  Subjective:    Patient ID: [name], male    DOB: April 20, 1941, 72 y.o.   MRN: [id number]  HPI  He returns for f/up and reports no more episodes of dizziness, he feels well today.  Review of Systems  Constitutional: Negative.  Negative for diaphoresis and fatigue.  HENT: Negative.   Eyes: Negative.   Respiratory: Negative.  Negative for cough, chest tightness, shortness of breath, wheezing and stridor.   Cardiovascular: Negative.  Negative for chest pain, palpitations and leg swelling.  Gastrointestinal: Negative.   Endocrine: Negative.   Genitourinary: Negative.   Musculoskeletal: Negative.   Skin: Negative.   Allergic/Immunologic: Negative.   Neurological: Negative for dizziness, weakness and light-headedness.  Hematological: Negative.  Negative for adenopathy. Does not bruise/bleed easily.  Psychiatric/Behavioral: Negative.        Objective:   Physical Exam  Vitals reviewed. Constitutional: He is oriented to person, place, and time. He appears well-developed and well-nourished. No distress.  HENT:  Head: Normocephalic and atraumatic.  Mouth/Throat: Oropharynx is clear and moist. No oropharyngeal exudate.  Eyes: Conjunctivae are normal. Right eye exhibits no discharge. Left eye exhibits no discharge. No scleral icterus.  Neck: Normal range of motion. Neck supple. No JVD present. No tracheal deviation present. No thyromegaly present.  Cardiovascular: Normal rate, regular rhythm, normal heart sounds and intact distal pulses.  Exam reveals no gallop and no friction rub.   No murmur heard. Pulmonary/Chest: Effort normal and breath sounds normal. No stridor. No respiratory distress. He has no wheezes. He has no rales. He exhibits no tenderness.  Abdominal: Soft. Bowel sounds are normal. He exhibits no distension and no mass. There is no tenderness. There is no rebound and no guarding.  Musculoskeletal: Normal range of motion. He exhibits edema (1+ pitting edema in BLE). He  exhibits no tenderness.  Lymphadenopathy:    He has no cervical adenopathy.  Neurological: He is oriented to person, place, and time.  Skin: Skin is warm and dry. No rash noted. He is not diaphoretic. No erythema. No pallor.  Psychiatric: He has a normal mood and affect. His behavior is normal. Judgment and thought content normal.     Lab Results  Component Value Date   WBC 7.7 08/08/2012   HGB 14.7 08/08/2012   HCT 41.7 08/08/2012   PLT 168 08/08/2012   GLUCOSE 101* 08/14/2012   CHOL 151 07/08/2012   TRIG 78.0 07/08/2012   HDL 53.80 07/08/2012   LDLCALC 82 07/08/2012   ALT 22 08/08/2012   AST 19 08/08/2012   NA 139 08/14/2012   K 4.1 08/14/2012   CL 100 08/14/2012   CREATININE 1.2 08/14/2012   BUN 26* 08/14/2012   CO2 31 08/14/2012   TSH 1.27 07/08/2012   PSA 1.06 07/08/2012   HGBA1C 5.4 07/08/2012       Assessment & Plan:

## 2012-09-12 NOTE — Assessment & Plan Note (Signed)
His renal function has improved 

## 2012-09-12 NOTE — Assessment & Plan Note (Signed)
His BP is well controlled 

## 2012-11-25 ENCOUNTER — Other Ambulatory Visit: Payer: Self-pay | Admitting: Internal Medicine

## 2012-12-11 ENCOUNTER — Other Ambulatory Visit: Payer: Self-pay | Admitting: Internal Medicine

## 2012-12-13 ENCOUNTER — Encounter: Payer: Self-pay | Admitting: Gastroenterology

## 2012-12-16 ENCOUNTER — Other Ambulatory Visit: Payer: Self-pay | Admitting: Internal Medicine

## 2013-01-08 ENCOUNTER — Encounter: Payer: Self-pay | Admitting: Internal Medicine

## 2013-01-08 ENCOUNTER — Other Ambulatory Visit (INDEPENDENT_AMBULATORY_CARE_PROVIDER_SITE_OTHER): Payer: Medicare Other

## 2013-01-08 ENCOUNTER — Ambulatory Visit (INDEPENDENT_AMBULATORY_CARE_PROVIDER_SITE_OTHER): Payer: Medicare Other | Admitting: Internal Medicine

## 2013-01-08 VITALS — BP 130/68 | HR 81 | Temp 97.5°F | Resp 16 | Wt 238.0 lb

## 2013-01-08 DIAGNOSIS — R7309 Other abnormal glucose: Secondary | ICD-10-CM

## 2013-01-08 DIAGNOSIS — I10 Essential (primary) hypertension: Secondary | ICD-10-CM

## 2013-01-08 DIAGNOSIS — N182 Chronic kidney disease, stage 2 (mild): Secondary | ICD-10-CM

## 2013-01-08 DIAGNOSIS — G4700 Insomnia, unspecified: Secondary | ICD-10-CM

## 2013-01-08 DIAGNOSIS — Z23 Encounter for immunization: Secondary | ICD-10-CM

## 2013-01-08 DIAGNOSIS — A630 Anogenital (venereal) warts: Secondary | ICD-10-CM | POA: Insufficient documentation

## 2013-01-08 LAB — BASIC METABOLIC PANEL
BUN: 13 mg/dL (ref 6–23)
GFR: 79.57 mL/min (ref >60.00)
Potassium: 3.3 mEq/L — ABNORMAL LOW (ref 3.5–5.1)

## 2013-01-08 MED ORDER — ZOLPIDEM TARTRATE 5 MG PO TABS: 5.0000 mg | ORAL_TABLET | Freq: Every evening | ORAL | Status: DC | PRN

## 2013-01-08 NOTE — Addendum Note (Signed)
Addended by: Rock Nephew T on: 01/08/2013 01:56 PM   Modules accepted: Orders

## 2013-01-08 NOTE — Patient Instructions (Signed)

## 2013-01-08 NOTE — Assessment & Plan Note (Signed)
Dermatology referral 

## 2013-01-08 NOTE — Assessment & Plan Note (Signed)
He will avoid nephrotoxic agents I will recheck his renal function today 

## 2013-01-08 NOTE — Progress Notes (Signed)
Subjective:    Patient ID: [name], male    DOB: January 07, 1941, 72 y.o.   MRN: [id number]  Hypertension This is a chronic problem. The current episode started more than 1 year ago. The problem is unchanged. The problem is controlled. Associated symptoms include peripheral edema (unchanged). Pertinent negatives include no anxiety, blurred vision, chest pain, headaches, malaise/fatigue, neck pain, orthopnea, palpitations, PND, shortness of breath or sweats. Risk factors for coronary artery disease include smoking/tobacco exposure. Past treatments include diuretics and angiotensin blockers. The current treatment provides moderate improvement. Compliance problems include diet and exercise.       Review of Systems  Constitutional: Negative.  Negative for fever, chills, malaise/fatigue, diaphoresis, appetite change and fatigue.  HENT: Negative.  Negative for neck pain.   Eyes: Negative.  Negative for blurred vision.  Respiratory: Negative.  Negative for cough, choking, chest tightness, shortness of breath, wheezing and stridor.   Cardiovascular: Negative.  Negative for chest pain, palpitations, orthopnea, leg swelling and PND.  Gastrointestinal: Negative.  Negative for nausea, vomiting, abdominal pain, diarrhea, constipation and blood in stool.  Endocrine: Negative.   Genitourinary: Negative.   Musculoskeletal: Negative.  Negative for myalgias, back pain, joint swelling, arthralgias and gait problem.  Skin: Negative.   Allergic/Immunologic: Negative.   Neurological: Negative.  Negative for dizziness, tremors, syncope, facial asymmetry, weakness, light-headedness, numbness and headaches.  Hematological: Negative.  Negative for adenopathy. Does not bruise/bleed easily.  Psychiatric/Behavioral: Positive for sleep disturbance (frequent awakenings). Negative for suicidal ideas, hallucinations, behavioral problems, confusion, self-injury, dysphoric mood, decreased concentration and agitation. The  patient is not nervous/anxious and is not hyperactive.        Objective:   Physical Exam  Vitals reviewed. Constitutional: He is oriented to person, place, and time. He appears well-developed and well-nourished. No distress.  HENT:  Head: Normocephalic and atraumatic.  Mouth/Throat: No oropharyngeal exudate.  Eyes: Conjunctivae are normal. Right eye exhibits no discharge. Left eye exhibits no discharge. No scleral icterus.  Neck: Normal range of motion. Neck supple. No JVD present. No tracheal deviation present. No thyromegaly present.  Cardiovascular: Normal rate, regular rhythm, normal heart sounds and intact distal pulses.  Exam reveals no gallop and no friction rub.   No murmur heard. Pulmonary/Chest: Effort normal and breath sounds normal. No stridor. No respiratory distress. He has no wheezes. He has no rales. He exhibits no tenderness.  Abdominal: Soft. Bowel sounds are normal. He exhibits no distension and no mass. There is no tenderness. There is no rebound and no guarding. Hernia confirmed negative in the right inguinal area and confirmed negative in the left inguinal area.  Genitourinary: Penis normal.    Right testis shows no mass, no swelling and no tenderness. Right testis is descended. Left testis shows no mass, no swelling and no tenderness. Left testis is descended. Circumcised. No penile erythema or penile tenderness. No discharge found.  Musculoskeletal: Normal range of motion. He exhibits edema (1+ pitting edema in BLE). He exhibits no tenderness.  Lymphadenopathy:    He has no cervical adenopathy.       Right: No inguinal adenopathy present.       Left: No inguinal adenopathy present.  Neurological: He is oriented to person, place, and time.  Skin: Skin is warm and dry. No rash noted. He is not diaphoretic. No erythema. No pallor.  Psychiatric: He has a normal mood and affect. His behavior is normal. Judgment and thought content normal.     Lab Results  Component  Value  Date   WBC 7.7 08/08/2012   HGB 14.7 08/08/2012   HCT 41.7 08/08/2012   PLT 168 08/08/2012   GLUCOSE 101* 08/14/2012   CHOL 151 07/08/2012   TRIG 78.0 07/08/2012   HDL 53.80 07/08/2012   LDLCALC 82 07/08/2012   ALT 22 08/08/2012   AST 19 08/08/2012   NA 139 08/14/2012   K 4.1 08/14/2012   CL 100 08/14/2012   CREATININE 1.2 08/14/2012   BUN 26* 08/14/2012   CO2 31 08/14/2012   TSH 1.27 07/08/2012   PSA 1.06 07/08/2012   HGBA1C 5.4 07/08/2012       Assessment & Plan:

## 2013-01-08 NOTE — Assessment & Plan Note (Signed)
His BP is well controlled Today I will check his lytes and renal function 

## 2013-01-08 NOTE — Assessment & Plan Note (Signed)
He will try ambien as needed 

## 2013-02-07 ENCOUNTER — Encounter: Payer: Self-pay | Admitting: Internal Medicine

## 2013-02-07 ENCOUNTER — Other Ambulatory Visit (INDEPENDENT_AMBULATORY_CARE_PROVIDER_SITE_OTHER): Payer: Medicare Other

## 2013-02-07 ENCOUNTER — Ambulatory Visit (INDEPENDENT_AMBULATORY_CARE_PROVIDER_SITE_OTHER)
Admission: RE | Admit: 2013-02-07 | Discharge: 2013-02-07 | Disposition: A | Discharge status: Home / Self Care | Payer: Medicare Other | Source: Ambulatory Visit | Attending: Internal Medicine | Admitting: Internal Medicine

## 2013-02-07 ENCOUNTER — Ambulatory Visit (INDEPENDENT_AMBULATORY_CARE_PROVIDER_SITE_OTHER): Payer: Medicare Other | Admitting: Internal Medicine

## 2013-02-07 VITALS — BP 130/86 | HR 76 | Temp 97.7°F | Ht 74.0 in | Wt 232.0 lb

## 2013-02-07 DIAGNOSIS — R52 Pain, unspecified: Secondary | ICD-10-CM

## 2013-02-07 DIAGNOSIS — I10 Essential (primary) hypertension: Secondary | ICD-10-CM

## 2013-02-07 DIAGNOSIS — M79609 Pain in unspecified limb: Secondary | ICD-10-CM

## 2013-02-07 DIAGNOSIS — R109 Unspecified abdominal pain: Secondary | ICD-10-CM

## 2013-02-07 DIAGNOSIS — M79604 Pain in right leg: Secondary | ICD-10-CM

## 2013-02-07 LAB — URINALYSIS, ROUTINE W REFLEX MICROSCOPIC
Bilirubin Urine: NEGATIVE
Hgb urine dipstick: NEGATIVE
Nitrite: NEGATIVE
pH: 7 (ref 5.0–8.0)

## 2013-02-07 MED ORDER — KETOROLAC TROMETHAMINE 60 MG/2ML IM SOLN
60.0000 mg | Freq: Once | INTRAMUSCULAR | Status: AC
  Administered 2013-02-07: 60 mg via INTRAMUSCULAR

## 2013-02-07 MED ORDER — CYCLOBENZAPRINE HCL 5 MG PO TABS: 5.0000 mg | ORAL_TABLET | Freq: Three times a day (TID) | ORAL | Status: DC | PRN

## 2013-02-07 MED ORDER — HYDROCODONE-ACETAMINOPHEN 5-325 MG PO TABS: 1.0000 | ORAL_TABLET | Freq: Four times a day (QID) | ORAL | Status: DC | PRN

## 2013-02-07 MED ORDER — KETOROLAC TROMETHAMINE 60 MG/2ML IM SOLN: 60.0000 mg | Freq: Once | INTRAMUSCULAR | Status: DC

## 2013-02-07 NOTE — Progress Notes (Signed)
Subjective:    Patient ID: [name], male    DOB: [birth year], 72 y.o.   MRN: [id number]  Back Pain This is a new problem. The current episode started in the past 7 days. The problem occurs constantly. The problem has been waxing and waning since onset. The pain is present in the lumbar spine. The quality of the pain is described as aching, shooting and stabbing. The pain radiates to the right thigh and right knee. The pain is at a severity of 8/10. The pain is worse during the night. The symptoms are aggravated by lying down. Stiffness is present at night. Associated symptoms include leg pain and tingling. Pertinent negatives include no abdominal pain, bladder incontinence, bowel incontinence, dysuria, fever, headaches, numbness, paresis, paresthesias, pelvic pain, perianal numbness or weakness. Risk factors include sedentary lifestyle. He has tried NSAIDs for the symptoms. The treatment provided no relief.    Past Medical History  Diagnosis Date  . Hypertension   . Hyperlipidemia   . COPD (chronic obstructive pulmonary disease)   . Osteoarthritis   . Skin cancer     history of: BCC on nose  . Genital warts 2009    Review of Systems  Constitutional: Negative for fever.  Gastrointestinal: Negative for abdominal pain and bowel incontinence.  Genitourinary: Negative for bladder incontinence, dysuria and pelvic pain.  Musculoskeletal: Positive for back pain.  Neurological: Positive for tingling. Negative for weakness, numbness, headaches and paresthesias.       Objective:   Physical Exam BP 130/86  Pulse 76  Temp(Src) 97.7 F (36.5 C) (Oral)  Ht 6\' 2"  (1.88 m)  Wt 232 lb (105.235 kg)  BMI 29.77 kg/m2  SpO2 97% Wt Readings from Last 3 Encounters:  02/07/13 232 lb (105.235 kg)  01/08/13 238 lb (107.956 kg)  09/12/12 233 lb 4 oz (105.802 kg)   Constitutional: he appears well-developed and well-nourished. No distress. spouse at side Cardiovascular: Normal rate, regular rhythm  and normal heart sounds.  No murmur heard. No BLE edema. Pulmonary/Chest: Effort normal and breath sounds normal. No respiratory distress. he has no wheezes.  Abdominal: Soft. Bowel sounds are normal. he exhibits no distension. There is no tenderness. no masses Musculoskeletal: Back: full range of motion of thoracic and lumbar spine. Mild-mod tender to palpation right flank. positive straight leg raise. DTR's are symmetrically intact. Sensation intact in all dermatomes of the lower extremities. Full strength to manual muscle testing. patient is able to heel toe walk without difficulty, but ambulates with antalgic gait. Neurological: he is alert and oriented to person, place, and time. No cranial nerve deficit. Coordination, balance, strength, speech and gait are normal.  Skin: Skin is warm and dry. No rash noted. No erythema.  Psychiatric: he has a normal mood and affect. behavior is normal. Judgment and thought content normal.  Lab Results  Component Value Date   WBC 7.7 08/08/2012   HGB 14.7 08/08/2012   HCT 41.7 08/08/2012   PLT 168 08/08/2012   GLUCOSE 93 01/08/2013   CHOL 151 07/08/2012   TRIG 78.0 07/08/2012   HDL 53.80 07/08/2012   LDLCALC 82 07/08/2012   ALT 22 08/08/2012   AST 19 08/08/2012   NA 138 01/08/2013   K 3.3* 01/08/2013   CL 99 01/08/2013   CREATININE 1.0 01/08/2013   BUN 13 01/08/2013   CO2 33* 01/08/2013   TSH 1.27 07/08/2012   PSA 1.06 07/08/2012   HGBA1C 5.4 07/08/2012        Assessment &  Plan:   Acute right flank pain, Ongoing 5 days  Radiation into right leg   Differential includes lumbar radiculopathy versus patient concern for kidney stone Denies history of other problem in past  Check urinalysis and DG lumbar spine IM Toradol 60 mg administered in office today Treat with hydrocodone every 6 hours prn and Flexeril every 8 hours -prescription provided today  If urinalysis negative, consider further lumbar evaluation for suspected radiculopathy  Education provided  to patient and spouse on same today

## 2013-02-07 NOTE — Patient Instructions (Addendum)
It was good to see you today.  We have reviewed your prior records including labs and tests today  Toradol 60 mg injection given to you for pain today  Test(s) ordered today. Your results will be released to MyChart (or called to you) after review, usually within 72hours after test completion. If any changes need to be made, you will be notified at that same time.  In the meanwhile, and treat pain with hydrocodone every 6 hours as needed and Flexeril for muscle spasm every 8 hours  prescriptions have been given to you or submitted electronically to your pharmacy -please take as directed and call if problems  Further testing and evaluation will depend on results and your response to treatment, please call sooner if problems  Back Pain, Adult Back pain is very common. The pain often gets better over time. The cause of back pain is usually not dangerous. Most people can learn to manage their back pain on their own.  HOME CARE   Stay active. Start with short walks on flat ground if you can. Try to walk farther each day.  Do not sit, drive, or stand in one place for more than 30 minutes. Do not stay in bed.  Do not avoid exercise or work. Activity can help your back heal faster.  Be careful when you bend or lift an object. Bend at your knees, keep the object close to you, and do not twist.  Sleep on a firm mattress. Lie on your side, and bend your knees. If you lie on your back, put a pillow under your knees.  Only take medicines as told by your doctor.  Put ice on the injured area.  Put ice in a plastic bag.  Place a towel between your skin and the bag.  Leave the ice on for 15-20 minutes, 3-4 times a day for the first 2 to 3 days. After that, you can switch between ice and heat packs.  Ask your doctor about back exercises or massage.  Avoid feeling anxious or stressed. Find good ways to deal with stress, such as exercise. GET HELP RIGHT AWAY IF:   Your pain does not go away  with rest or medicine.  Your pain does not go away in 1 week.  You have new problems.  You do not feel well.  The pain spreads into your legs.  You cannot control when you poop (bowel movement) or pee (urinate).  Your arms or legs feel weak or lose feeling (numbness).  You feel sick to your stomach (nauseous) or throw up (vomit).  You have belly (abdominal) pain.  You feel like you may pass out (faint). MAKE SURE YOU:   Understand these instructions.  Will watch your condition.  Will get help right away if you are not doing well or get worse. Document Released: 09/27/2007 Document Revised: 07/03/2011 Document Reviewed: 08/29/2010 [facility] Patient Information 2014 [place], [place].

## 2013-02-08 ENCOUNTER — Encounter: Payer: Self-pay | Admitting: Internal Medicine

## 2013-02-08 NOTE — Assessment & Plan Note (Signed)
BP Readings from Last 3 Encounters:  02/07/13 130/86  01/08/13 130/68  09/12/12 130/70   The current medical regimen is effective;  continue present plan and medications.

## 2013-02-25 ENCOUNTER — Encounter: Payer: Self-pay | Admitting: Internal Medicine

## 2013-02-25 ENCOUNTER — Ambulatory Visit (INDEPENDENT_AMBULATORY_CARE_PROVIDER_SITE_OTHER): Payer: Medicare Other | Admitting: Internal Medicine

## 2013-02-25 VITALS — BP 140/60 | HR 94 | Temp 98.4°F | Resp 16 | Ht 74.0 in | Wt 234.0 lb

## 2013-02-25 DIAGNOSIS — M5416 Radiculopathy, lumbar region: Secondary | ICD-10-CM | POA: Insufficient documentation

## 2013-02-25 DIAGNOSIS — IMO0002 Reserved for concepts with insufficient information to code with codable children: Secondary | ICD-10-CM

## 2013-02-25 MED ORDER — IBUPROFEN 600 MG PO TABS: 600.0000 mg | ORAL_TABLET | Freq: Three times a day (TID) | ORAL | Status: DC | PRN

## 2013-02-25 MED ORDER — OXYCODONE-ACETAMINOPHEN 7.5-325 MG PO TABS: 1.0000 | ORAL_TABLET | ORAL | Status: DC | PRN

## 2013-02-25 MED ORDER — METHYLPREDNISOLONE ACETATE 80 MG/ML IJ SUSP
120.0000 mg | Freq: Once | INTRAMUSCULAR | Status: AC
  Administered 2013-02-25: 120 mg via INTRAMUSCULAR

## 2013-02-25 NOTE — Patient Instructions (Signed)
Back Pain, Adult  Low back pain is very common. About 1 in 5 people have back pain. The cause of low back pain is rarely dangerous. The pain often gets better over time. About half of people with a sudden onset of back pain feel better in just 2 weeks. About 8 in 10 people feel better by 6 weeks.   CAUSES  Some common causes of back pain include:  · Strain of the muscles or ligaments supporting the spine.  · Wear and tear (degeneration) of the spinal discs.  · Arthritis.  · Direct injury to the back.  DIAGNOSIS  Most of the time, the direct cause of low back pain is not known. However, back pain can be treated effectively even when the exact cause of the pain is unknown. Answering your caregiver's questions about your overall health and symptoms is one of the most accurate ways to make sure the cause of your pain is not dangerous. If your caregiver needs more information, he or she may order lab work or imaging tests (X-rays or MRIs). However, even if imaging tests show changes in your back, this usually does not require surgery.  HOME CARE INSTRUCTIONS  For many people, back pain returns. Since low back pain is rarely dangerous, it is often a condition that people can learn to manage on their own.   · Remain active. It is stressful on the back to sit or stand in one place. Do not sit, drive, or stand in one place for more than 30 minutes at a time. Take short walks on level surfaces as soon as pain allows. Try to increase the length of time you walk each day.  · Do not stay in bed. Resting more than 1 or 2 days can delay your recovery.  · Do not avoid exercise or work. Your body is made to move. It is not dangerous to be active, even though your back may hurt. Your back will likely heal faster if you return to being active before your pain is gone.  · Pay attention to your body when you  bend and lift. Many people have less discomfort when lifting if they bend their knees, keep the load close to their bodies, and  avoid twisting. Often, the most comfortable positions are those that put less stress on your recovering back.  · Find a comfortable position to sleep. Use a firm mattress and lie on your side with your knees slightly bent. If you lie on your back, put a pillow under your knees.  · Only take over-the-counter or prescription medicines as directed by your caregiver. Over-the-counter medicines to reduce pain and inflammation are often the most helpful. Your caregiver may prescribe muscle relaxant drugs. These medicines help dull your pain so you can more quickly return to your normal activities and healthy exercise.  · Put ice on the injured area.  · Put ice in a plastic bag.  · Place a towel between your skin and the bag.  · Leave the ice on for 15-20 minutes, 3-4 times a day for the first 2 to 3 days. After that, ice and heat may be alternated to reduce pain and spasms.  · Ask your caregiver about trying back exercises and gentle massage. This may be of some benefit.  · Avoid feeling anxious or stressed. Stress increases muscle tension and can worsen back pain. It is important to recognize when you are anxious or stressed and learn ways to manage it. Exercise is a great option.  SEEK MEDICAL CARE IF:  · You have pain that is not relieved with rest or   medicine.  · You have pain that does not improve in 1 week.  · You have new symptoms.  · You are generally not feeling well.  SEEK IMMEDIATE MEDICAL CARE IF:   · You have pain that radiates from your back into your legs.  · You develop new bowel or bladder control problems.  · You have unusual weakness or numbness in your arms or legs.  · You develop nausea or vomiting.  · You develop abdominal pain.  · You feel faint.  Document Released: 04/10/2005 Document Revised: 10/10/2011 Document Reviewed: 08/29/2010  [employer]® Patient Information ©2014 [employer].

## 2013-02-25 NOTE — Progress Notes (Signed)
Subjective:    Patient ID: [name], male    DOB: 1940/10/22, 72 y.o.   MRN: [id number]  Back Pain This is a recurrent problem. The current episode started 1 to 4 weeks ago. The problem occurs constantly. The problem is unchanged. The pain is present in the lumbar spine. The quality of the pain is described as shooting and stabbing. The pain radiates to the right thigh. The pain is at a severity of 6/10. The pain is moderate. The pain is worse during the day. The symptoms are aggravated by standing and position. Associated symptoms include leg pain (right leg). Pertinent negatives include no abdominal pain, bladder incontinence, bowel incontinence, chest pain, dysuria, fever, headaches, numbness, paresis, paresthesias, pelvic pain, perianal numbness, tingling, weakness or weight loss. Risk factors include obesity and lack of exercise. He has tried analgesics and muscle relaxant for the symptoms. The treatment provided mild relief.      Review of Systems  Constitutional: Negative.  Negative for fever, chills, weight loss, diaphoresis, activity change, appetite change, fatigue and unexpected weight change.  HENT: Negative.   Eyes: Negative.   Respiratory: Negative.  Negative for cough, chest tightness, shortness of breath, wheezing and stridor.   Cardiovascular: Negative.  Negative for chest pain, palpitations and leg swelling.  Gastrointestinal: Negative.  Negative for nausea, vomiting, abdominal pain, diarrhea, constipation and bowel incontinence.  Endocrine: Negative.   Genitourinary: Negative.  Negative for bladder incontinence, dysuria, urgency, frequency, hematuria, flank pain, decreased urine volume and pelvic pain.  Musculoskeletal: Positive for back pain. Negative for arthralgias, gait problem, joint swelling, myalgias, neck pain and neck stiffness.  Skin: Negative.   Allergic/Immunologic: Negative.   Neurological: Negative.  Negative for dizziness, tingling, tremors, weakness,  numbness, headaches and paresthesias.  Hematological: Negative.  Negative for adenopathy. Does not bruise/bleed easily.  Psychiatric/Behavioral: Negative.        Objective:   Physical Exam  Vitals reviewed. Constitutional: He is oriented to person, place, and time. He appears well-developed and well-nourished. No distress.  HENT:  Head: Normocephalic and atraumatic.  Mouth/Throat: Oropharynx is clear and moist. No oropharyngeal exudate.  Eyes: Conjunctivae are normal. Right eye exhibits no discharge. Left eye exhibits no discharge. No scleral icterus.  Neck: Normal range of motion. Neck supple. No JVD present. No tracheal deviation present. No thyromegaly present.  Cardiovascular: Normal rate, regular rhythm, normal heart sounds and intact distal pulses.  Exam reveals no gallop and no friction rub.   No murmur heard. Pulmonary/Chest: Effort normal and breath sounds normal. No stridor. No respiratory distress. He has no wheezes. He has no rales. He exhibits no tenderness.  Abdominal: Soft. Bowel sounds are normal. He exhibits no distension and no mass. There is no tenderness. There is no rebound and no guarding.  Musculoskeletal: Normal range of motion. He exhibits no edema and no tenderness.       Lumbar back: Normal. He exhibits normal range of motion, no tenderness, no bony tenderness, no swelling, no edema, no deformity, no laceration, no pain, no spasm and normal pulse.  Lymphadenopathy:    He has no cervical adenopathy.  Neurological: He is alert and oriented to person, place, and time. He has normal strength. He displays abnormal reflex. He displays no atrophy and no tremor. No cranial nerve deficit or sensory deficit. He exhibits normal muscle tone. He displays a negative Romberg sign. He displays no seizure activity. Coordination and gait normal.  Reflex Scores:      Tricep reflexes are 1+ on  the right side and 1+ on the left side.      Bicep reflexes are 1+ on the right side and 1+  on the left side.      Brachioradialis reflexes are 1+ on the right side and 1+ on the left side.      Patellar reflexes are 0 on the right side and 1+ on the left side.      Achilles reflexes are 0 on the right side and 1+ on the left side. +SLR in RLE  Skin: Skin is warm and dry. No rash noted. He is not diaphoretic. No erythema. No pallor.    Lab Results  Component Value Date   WBC 7.7 08/08/2012   HGB 14.7 08/08/2012   HCT 41.7 08/08/2012   PLT 168 08/08/2012   GLUCOSE 93 01/08/2013   CHOL 151 07/08/2012   TRIG 78.0 07/08/2012   HDL 53.80 07/08/2012   LDLCALC 82 07/08/2012   ALT 22 08/08/2012   AST 19 08/08/2012   NA 138 01/08/2013   K 3.3* 01/08/2013   CL 99 01/08/2013   CREATININE 1.0 01/08/2013   BUN 13 01/08/2013   CO2 33* 01/08/2013   TSH 1.27 07/08/2012   PSA 1.06 07/08/2012   HGBA1C 5.4 07/08/2012        Assessment & Plan:

## 2013-02-25 NOTE — Assessment & Plan Note (Addendum)
I think he has a disc herniation so I gave him an injection of depo-medrol IM I will also upgrade his pain relief to percocet and will cont the MR I have asked him to start nsaids with motrin I have ordered an MRI to see how severe this is and I have asked him to see pain management for further evaluation

## 2013-02-25 NOTE — Progress Notes (Signed)
Pre-visit discussion using our clinic review tool. No additional management support is needed unless otherwise documented below in the visit note.  

## 2013-03-03 ENCOUNTER — Encounter: Payer: Self-pay | Admitting: Internal Medicine

## 2013-03-03 ENCOUNTER — Ambulatory Visit
Admission: RE | Admit: 2013-03-03 | Discharge: 2013-03-03 | Disposition: A | Discharge status: Home / Self Care | Payer: Medicare Other | Source: Ambulatory Visit | Attending: Internal Medicine | Admitting: Internal Medicine

## 2013-03-03 DIAGNOSIS — M5416 Radiculopathy, lumbar region: Secondary | ICD-10-CM

## 2013-03-06 ENCOUNTER — Encounter: Payer: Self-pay | Admitting: Physical Medicine & Rehabilitation

## 2013-03-25 ENCOUNTER — Encounter: Payer: Self-pay | Admitting: Internal Medicine

## 2013-03-25 ENCOUNTER — Ambulatory Visit (INDEPENDENT_AMBULATORY_CARE_PROVIDER_SITE_OTHER): Payer: Medicare Other | Admitting: Internal Medicine

## 2013-03-25 VITALS — BP 140/72 | HR 85 | Temp 97.8°F | Resp 16 | Ht 74.0 in | Wt 223.0 lb

## 2013-03-25 DIAGNOSIS — M5416 Radiculopathy, lumbar region: Secondary | ICD-10-CM

## 2013-03-25 DIAGNOSIS — IMO0002 Reserved for concepts with insufficient information to code with codable children: Secondary | ICD-10-CM

## 2013-03-25 NOTE — Progress Notes (Signed)
Subjective:    Patient ID: [name], male    DOB: 1940-09-26, 72 y.o.   MRN: [id number]  Back Pain This is a recurrent problem. The current episode started more than 1 month ago. The problem occurs rarely. The problem has been rapidly improving since onset. The pain is present in the lumbar spine. The quality of the pain is described as aching. The pain does not radiate. The pain is at a severity of 1/10. The pain is mild. The pain is worse during the day. The symptoms are aggravated by bending and position. Pertinent negatives include no abdominal pain, bladder incontinence, bowel incontinence, chest pain, dysuria, fever, headaches, leg pain, numbness, paresis, paresthesias, pelvic pain, perianal numbness, tingling, weakness or weight loss. He has tried muscle relaxant, NSAIDs and analgesics for the symptoms. The treatment provided significant relief.      Review of Systems  Constitutional: Negative.  Negative for fever, chills, weight loss, diaphoresis, activity change, appetite change, fatigue and unexpected weight change.  HENT: Negative.   Eyes: Negative.   Respiratory: Negative.  Negative for cough, choking, chest tightness, shortness of breath, wheezing and stridor.   Cardiovascular: Negative.  Negative for chest pain, palpitations and leg swelling.  Gastrointestinal: Negative.  Negative for nausea, vomiting, abdominal pain, diarrhea, constipation, blood in stool and bowel incontinence.  Endocrine: Negative.   Genitourinary: Negative.  Negative for bladder incontinence, dysuria and pelvic pain.  Musculoskeletal: Positive for back pain. Negative for arthralgias, gait problem, joint swelling, myalgias, neck pain and neck stiffness.  Skin: Negative.   Allergic/Immunologic: Negative.   Neurological: Negative.  Negative for dizziness, tingling, tremors, seizures, syncope, facial asymmetry, speech difficulty, weakness, light-headedness, numbness, headaches and paresthesias.    Hematological: Negative.  Negative for adenopathy. Does not bruise/bleed easily.  Psychiatric/Behavioral: Negative.        Objective:   Physical Exam  Vitals reviewed. Constitutional: He is oriented to person, place, and time. He appears well-developed and well-nourished. No distress.  HENT:  Head: Normocephalic and atraumatic.  Mouth/Throat: Oropharynx is clear and moist. No oropharyngeal exudate.  Eyes: Conjunctivae are normal. Right eye exhibits no discharge. Left eye exhibits no discharge. No scleral icterus.  Neck: Normal range of motion. Neck supple. No JVD present. No tracheal deviation present. No thyromegaly present.  Cardiovascular: Normal rate, regular rhythm, normal heart sounds and intact distal pulses.  Exam reveals no gallop and no friction rub.   No murmur heard. Pulmonary/Chest: Effort normal and breath sounds normal. No stridor. No respiratory distress. He has no wheezes. He has no rales. He exhibits no tenderness.  Abdominal: Soft. Bowel sounds are normal. He exhibits no distension and no mass. There is no tenderness. There is no rebound and no guarding.  Musculoskeletal: Normal range of motion. He exhibits no edema and no tenderness.       Lumbar back: Normal. He exhibits normal range of motion, no tenderness, no bony tenderness, no swelling, no edema, no deformity, no laceration, no pain, no spasm and normal pulse.  Lymphadenopathy:    He has no cervical adenopathy.  Neurological: He is alert and oriented to person, place, and time. He has normal strength. He displays no atrophy, no tremor and normal reflexes. No cranial nerve deficit or sensory deficit. He exhibits normal muscle tone. He displays a negative Romberg sign. He displays no seizure activity. Coordination and gait normal. He displays no Babinski's sign on the right side. He displays no Babinski's sign on the left side.  Reflex Scores:  Tricep reflexes are 1+ on the right side and 1+ on the left side.       Bicep reflexes are 1+ on the right side and 1+ on the left side.      Brachioradialis reflexes are 1+ on the right side and 1+ on the left side.      Patellar reflexes are 1+ on the right side and 1+ on the left side.      Achilles reflexes are 1+ on the right side and 1+ on the left side. Skin: Skin is warm and dry. No rash noted. He is not diaphoretic. No erythema. No pallor.    Lab Results  Component Value Date   WBC 7.7 08/08/2012   HGB 14.7 08/08/2012   HCT 41.7 08/08/2012   PLT 168 08/08/2012   GLUCOSE 93 01/08/2013   CHOL 151 07/08/2012   TRIG 78.0 07/08/2012   HDL 53.80 07/08/2012   LDLCALC 82 07/08/2012   ALT 22 08/08/2012   AST 19 08/08/2012   NA 138 01/08/2013   K 3.3* 01/08/2013   CL 99 01/08/2013   CREATININE 1.0 01/08/2013   BUN 13 01/08/2013   CO2 33* 01/08/2013   TSH 1.27 07/08/2012   PSA 1.06 07/08/2012   HGBA1C 5.4 07/08/2012        Assessment & Plan:

## 2013-03-25 NOTE — Progress Notes (Signed)
Pre visit review using our clinic review tool, if applicable. No additional management support is needed unless otherwise documented below in the visit note. 

## 2013-03-25 NOTE — Patient Instructions (Signed)
Back Pain, Adult Low back pain is very common. About 1 in 5 people have back pain.The cause of low back pain is rarely dangerous. The pain often gets better over time.About half of people with a sudden onset of back pain feel better in just 2 weeks. About 8 in 10 people feel better by 6 weeks.  CAUSES Some common causes of back pain include:  Strain of the muscles or ligaments supporting the spine.  Wear and tear (degeneration) of the spinal discs.  Arthritis.  Direct injury to the back. DIAGNOSIS Most of the time, the direct cause of low back pain is not known.However, back pain can be treated effectively even when the exact cause of the pain is unknown.Answering your caregiver's questions about your overall health and symptoms is one of the most accurate ways to make sure the cause of your pain is not dangerous. If your caregiver needs more information, he or she may order lab work or imaging tests (X-rays or MRIs).However, even if imaging tests show changes in your back, this usually does not require surgery. HOME CARE INSTRUCTIONS For many people, back pain returns.Since low back pain is rarely dangerous, it is often a condition that people can learn to manageon their own.   Remain active. It is stressful on the back to sit or stand in one place. Do not sit, drive, or stand in one place for more than 30 minutes at a time. Take short walks on level surfaces as soon as pain allows.Try to increase the length of time you walk each day.  Do not stay in bed.Resting more than 1 or 2 days can delay your recovery.  Do not avoid exercise or work.Your body is made to move.It is not dangerous to be active, even though your back may hurt.Your back will likely heal faster if you return to being active before your pain is gone.  Pay attention to your body when you bend and lift. Many people have less discomfortwhen lifting if they bend their knees, keep the load close to their bodies,and  avoid twisting. Often, the most comfortable positions are those that put less stress on your recovering back.  Find a comfortable position to sleep. Use a firm mattress and lie on your side with your knees slightly bent. If you lie on your back, put a pillow under your knees.  Only take over-the-counter or prescription medicines as directed by your caregiver. Over-the-counter medicines to reduce pain and inflammation are often the most helpful.Your caregiver may prescribe muscle relaxant drugs.These medicines help dull your pain so you can more quickly return to your normal activities and healthy exercise.  Put ice on the injured area.  Put ice in a plastic bag.  Place a towel between your skin and the bag.  Leave the ice on for 15-20 minutes, 03-04 times a day for the first 2 to 3 days. After that, ice and heat may be alternated to reduce pain and spasms.  Ask your caregiver about trying back exercises and gentle massage. This may be of some benefit.  Avoid feeling anxious or stressed.Stress increases muscle tension and can worsen back pain.It is important to recognize when you are anxious or stressed and learn ways to manage it.Exercise is a great option. SEEK MEDICAL CARE IF:  You have pain that is not relieved with rest or medicine.  You have pain that does not improve in 1 week.  You have new symptoms.  You are generally not feeling well. SEEK   IMMEDIATE MEDICAL CARE IF:   You have pain that radiates from your back into your legs.  You develop new bowel or bladder control problems.  You have unusual weakness or numbness in your arms or legs.  You develop nausea or vomiting.  You develop abdominal pain.  You feel faint. Document Released: 04/10/2005 Document Revised: 10/10/2011 Document Reviewed: 08/29/2010 [employer] Patient Information 2014 [employer].  

## 2013-03-25 NOTE — Assessment & Plan Note (Signed)
His low back pain has nearly resolved There are no radicular s/s today He will continue the current meds as needed

## 2013-04-11 ENCOUNTER — Ambulatory Visit: Payer: Medicare Other | Admitting: Physical Medicine & Rehabilitation

## 2013-05-12 ENCOUNTER — Ambulatory Visit: Payer: Medicare Other | Admitting: Internal Medicine

## 2013-07-11 ENCOUNTER — Other Ambulatory Visit: Payer: Self-pay | Admitting: Internal Medicine

## 2013-07-11 ENCOUNTER — Encounter: Payer: Self-pay | Admitting: Internal Medicine

## 2013-07-11 DIAGNOSIS — G4700 Insomnia, unspecified: Secondary | ICD-10-CM

## 2013-07-11 MED ORDER — TRAZODONE HCL 50 MG PO TABS: 50.0000 mg | ORAL_TABLET | Freq: Every evening | ORAL | Status: DC | PRN

## 2013-07-11 MED ORDER — PROMETHAZINE-DM 6.25-15 MG/5ML PO SYRP
5.0000 mL | ORAL_SOLUTION | Freq: Four times a day (QID) | ORAL | Status: DC | PRN
Start: 2013-07-11 — End: 2013-09-23

## 2013-07-16 ENCOUNTER — Other Ambulatory Visit: Payer: Self-pay | Admitting: Internal Medicine

## 2013-07-28 IMAGING — CR DG CHEST 2V
2 series · 2 of 2 positions shown · non-contrast
Comparison: Chest radiograph performed 03/12/2012

CLINICAL DATA: Shortness of breath for 24 hours.

CHEST - 2 VIEW

[w chest lat]
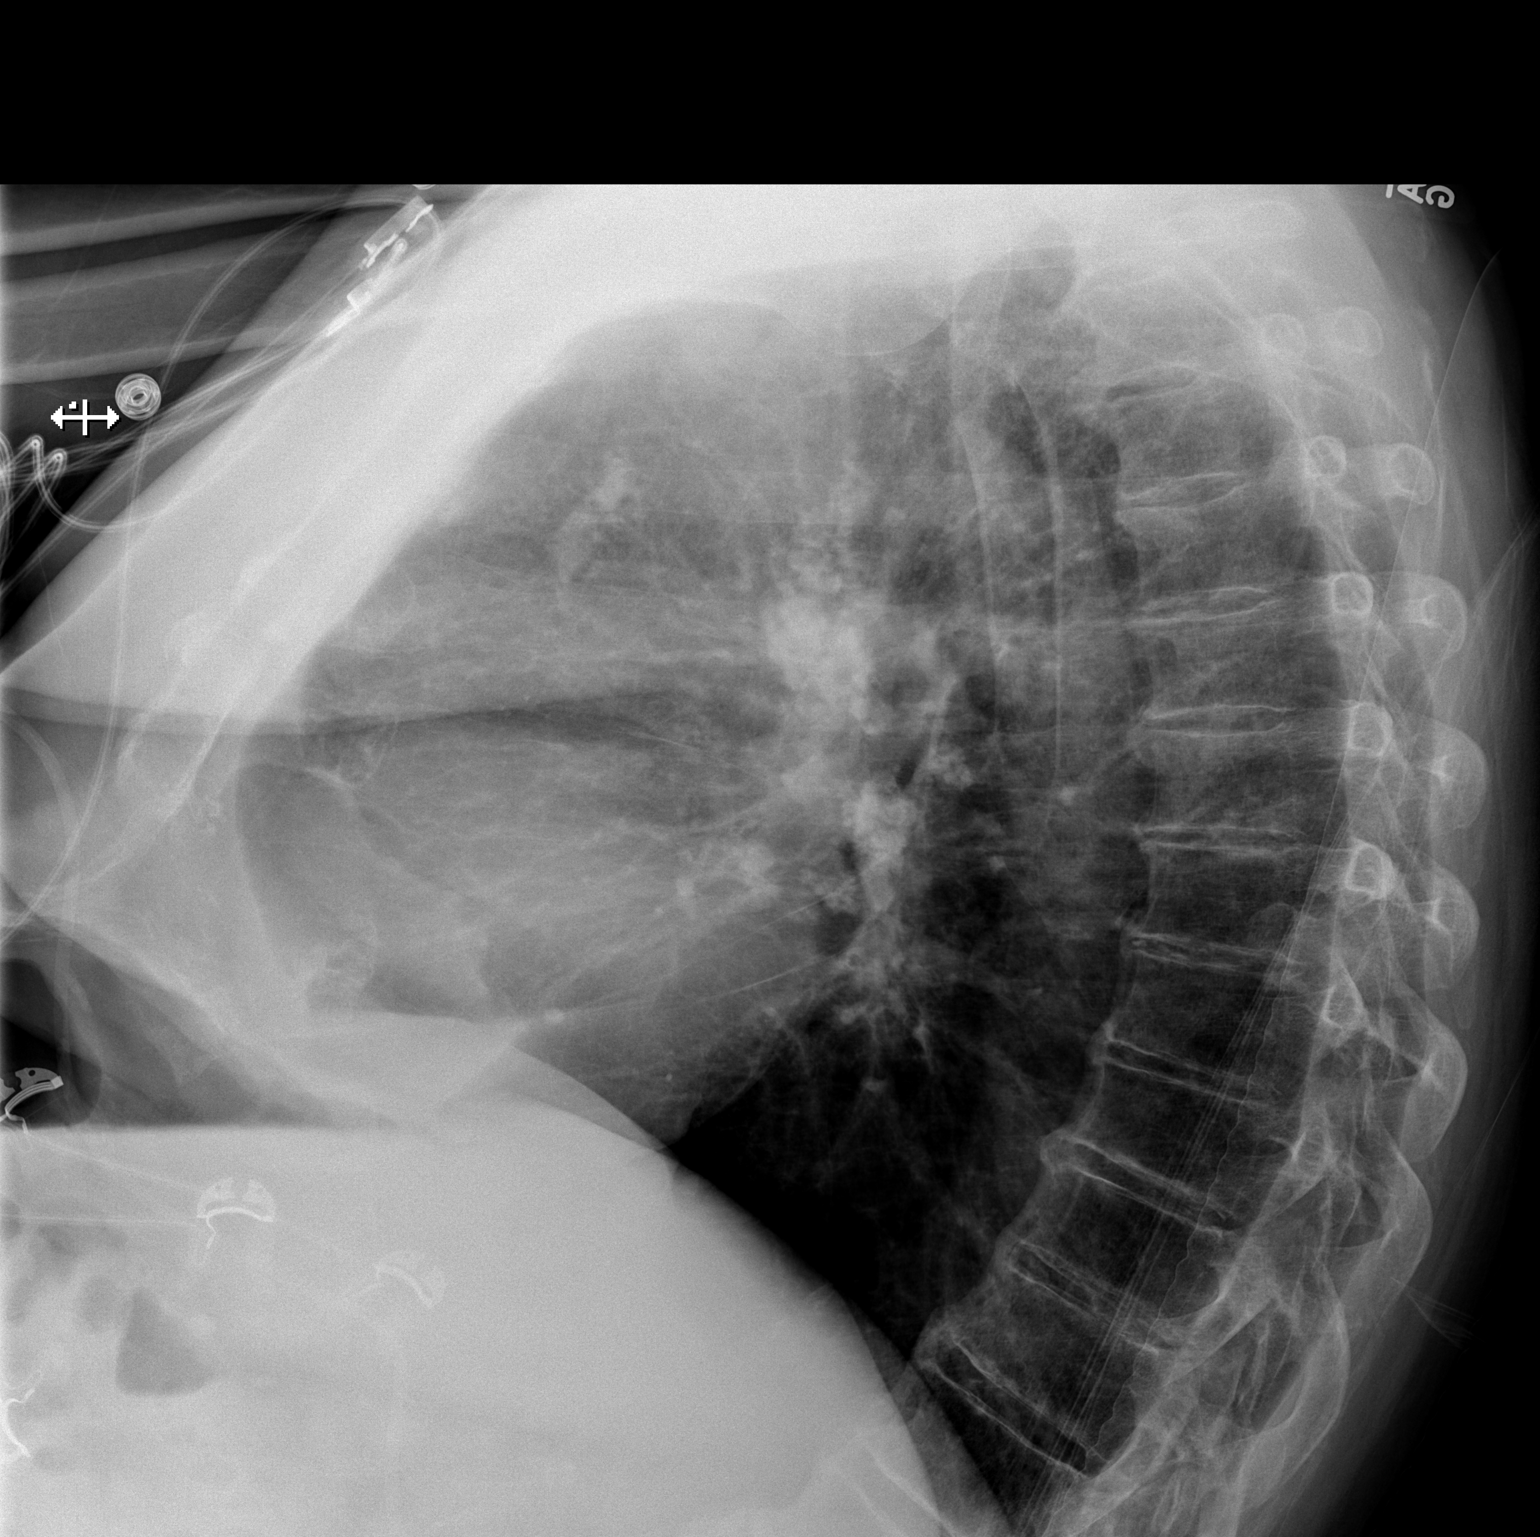

[w chest pa]
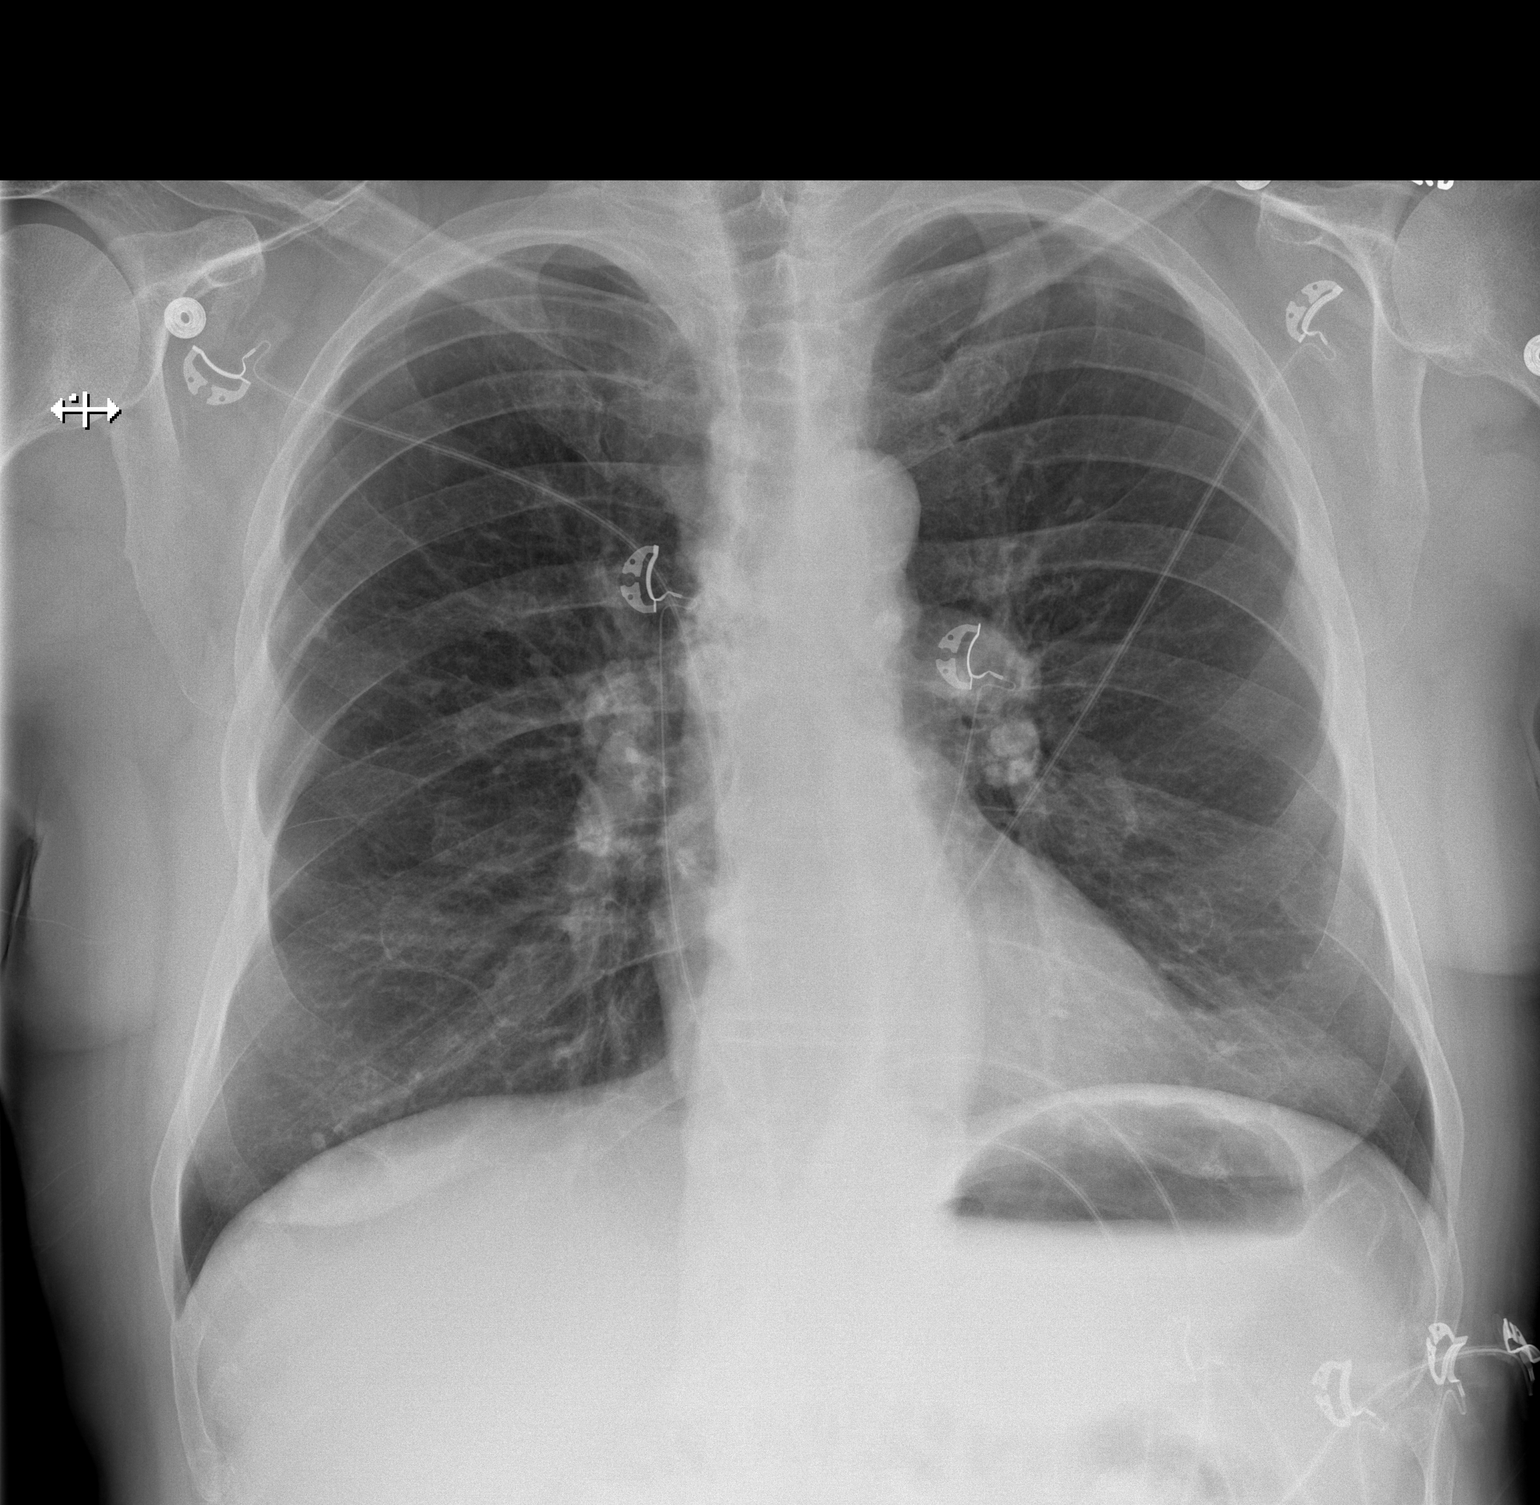

[2 of 2 positions shown; findings below may reference images not displayed]

FINDINGS: The lungs are well-aerated.  There is a vague 1.6 cm
nodular density at the left lung apex; this appears to have first
been present in 4277, though it is now somewhat better defined.
There is no evidence of pleural effusion or pneumothorax.  Minimal
left basilar density is thought to reflect overlying soft tissues.

The heart is normal in size; the mediastinal contour is within
normal limits.  No acute osseous abnormalities are seen.
IMPRESSION: Vague 1.6 cm nodular density at the left lung apex appears to have
first been present in 4277, though it is now somewhat better
defined.  CT of the chest would be helpful for further evaluation,
on an elective non-emergent basis.  Lungs otherwise grossly clear.

## 2013-07-28 IMAGING — CT CT ABD-PELV W/O CM
1 of 2 series · 15 of 32 positions shown, 19 images · non-contrast
Comparison: None.

CLINICAL DATA: Abdominal pain.

CT ABDOMEN AND PELVIS WITHOUT CONTRAST
TECHNIQUE: Multidetector CT imaging of the abdomen and pelvis was
performed following the standard protocol without intravenous
contrast.

[Series 2: abd/pel w/o · axial · non-contrast · 0.74mm/px · z∈[+1279,+1674]mm · 15 of 87 slices shown, 19 images]
[im 4/87  soft-tissue]
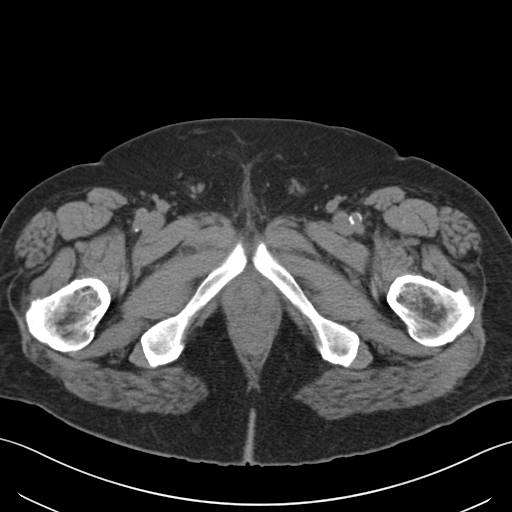
[im 4/87  bone]
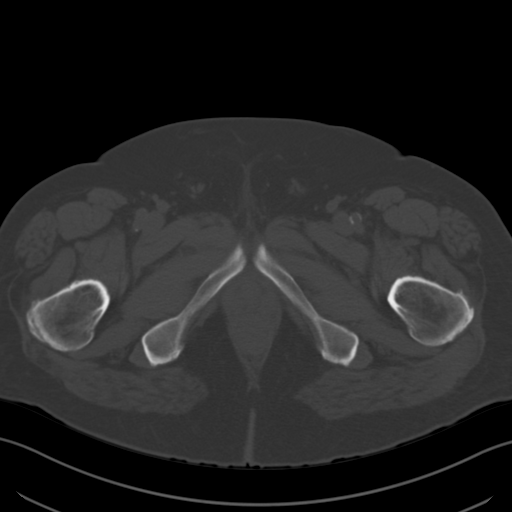
[im 10/87  soft-tissue]
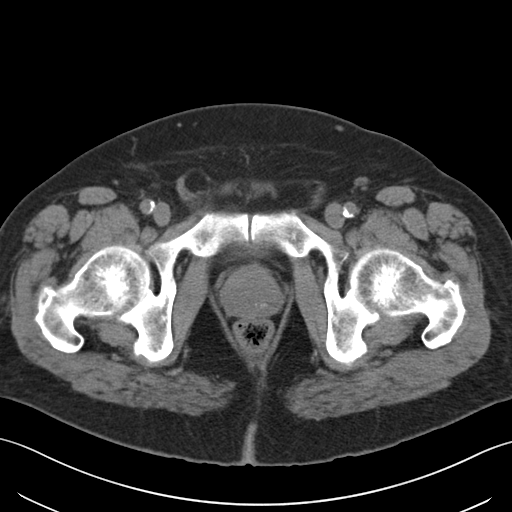
[im 17/87  soft-tissue]
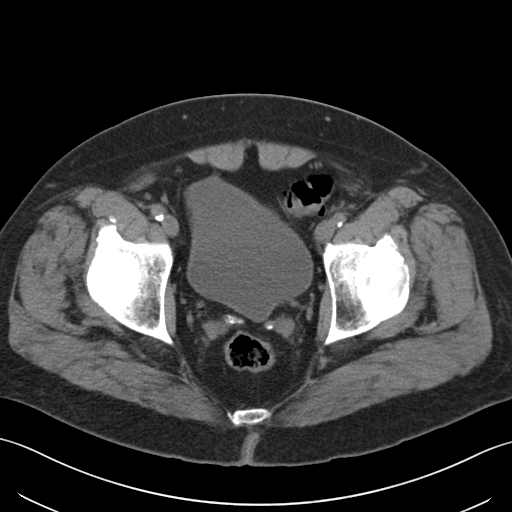
[im 24/87  soft-tissue]
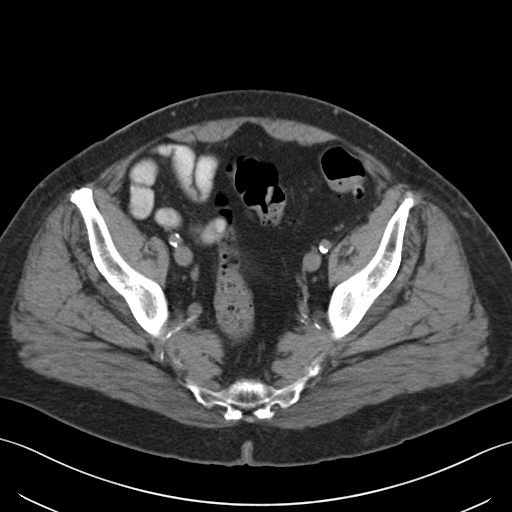
[im 30/87  soft-tissue]
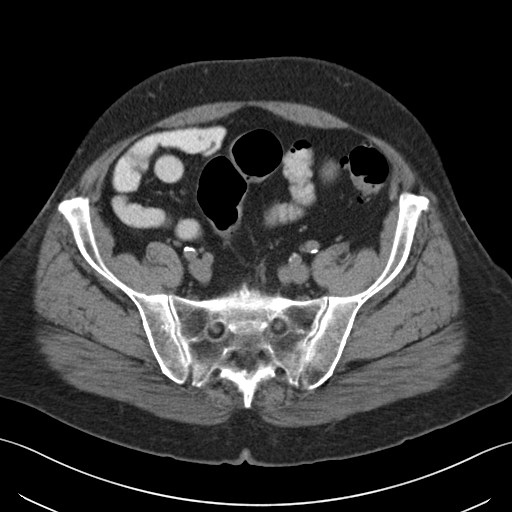
[im 37/87  soft-tissue]
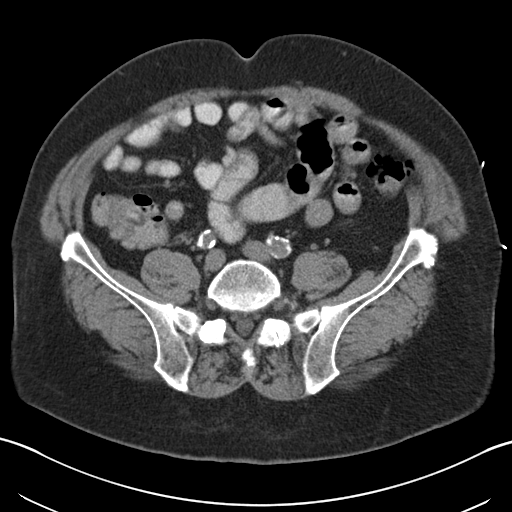
[im 44/87  soft-tissue]
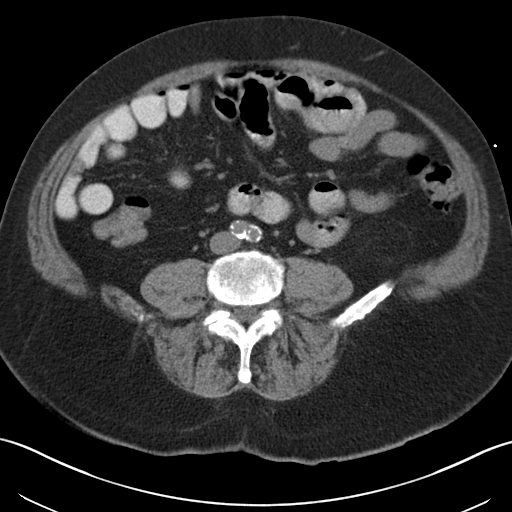
[im 50/87  soft-tissue]
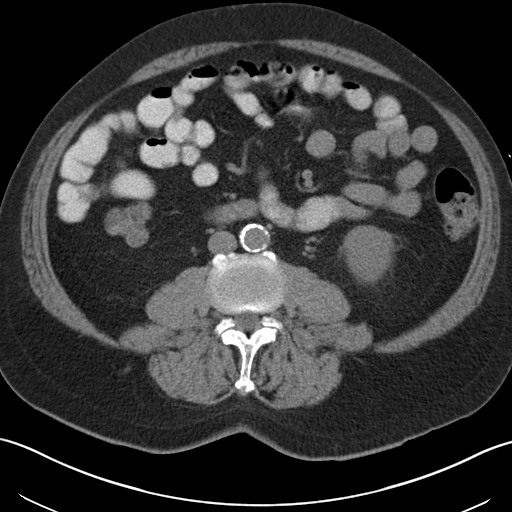
[im 57/87  soft-tissue]
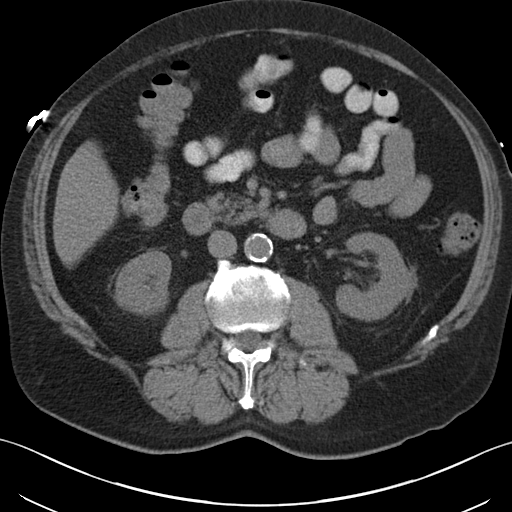
[im 57/87  bone]
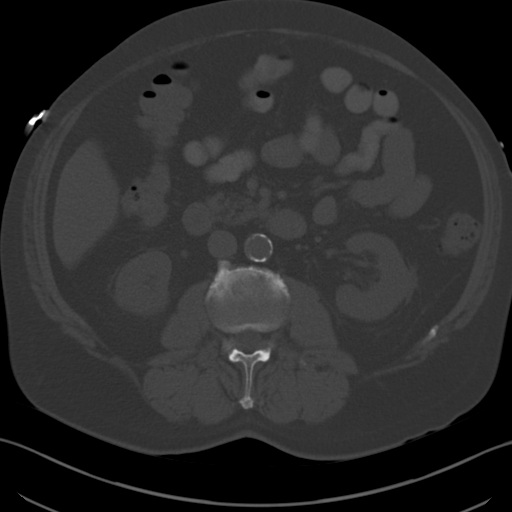
[im 63/87  soft-tissue]
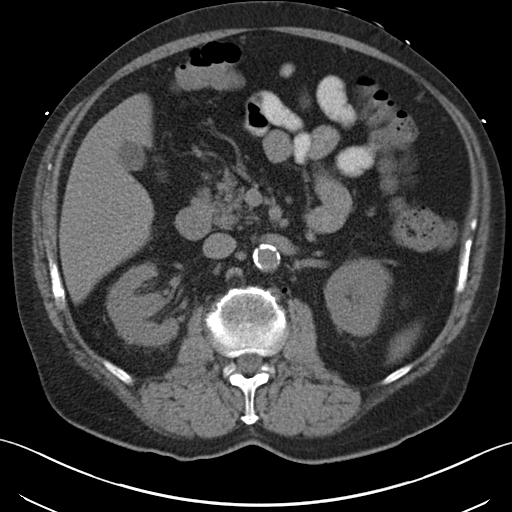
[im 70/87  soft-tissue]
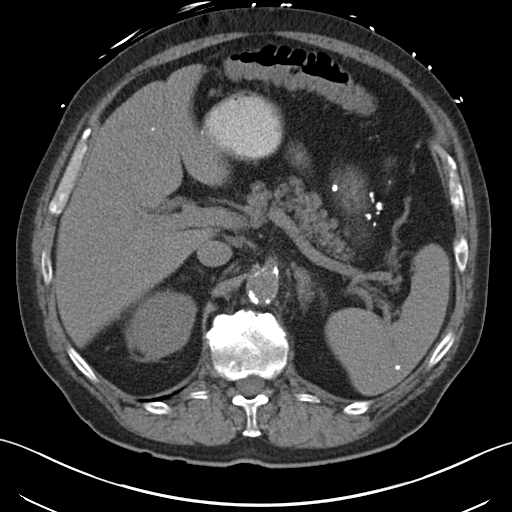
[im 73/87  lung]
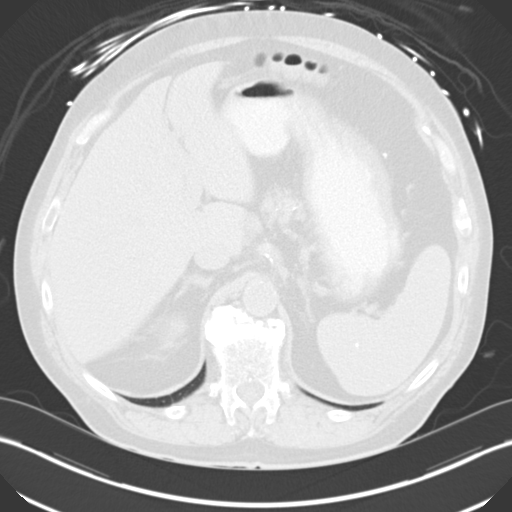
[im 77/87  soft-tissue]
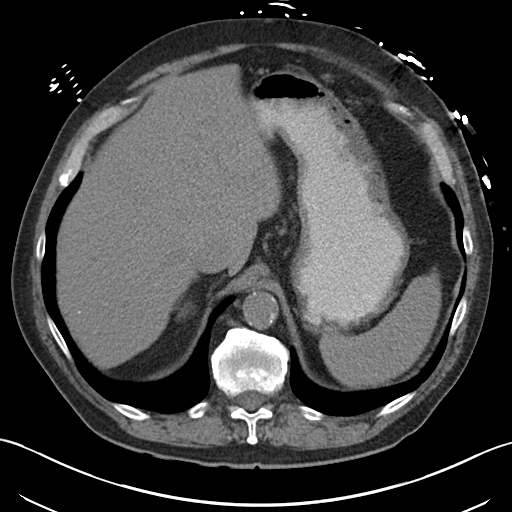
[im 77/87  lung]
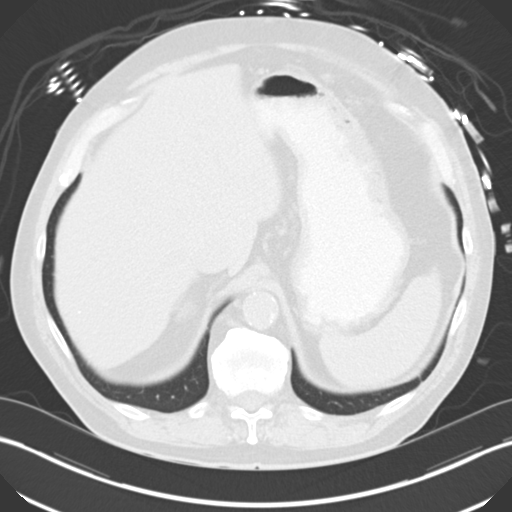
[im 80/87  lung]
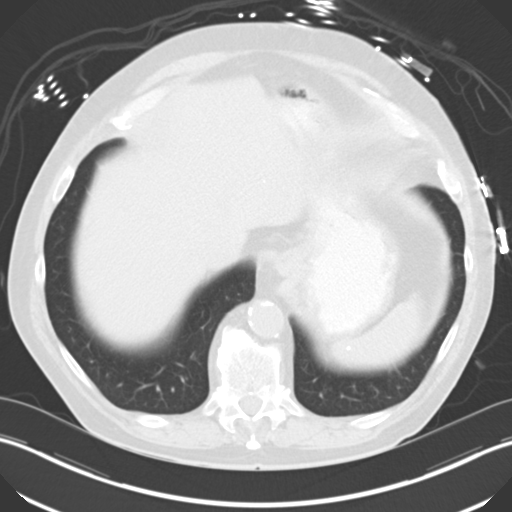
[im 83/87  soft-tissue]
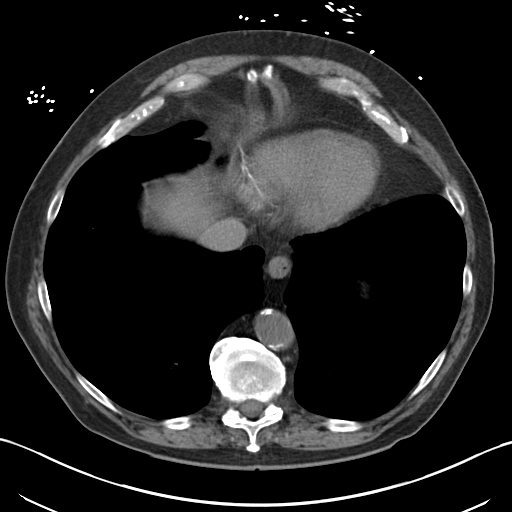
[im 83/87  lung]
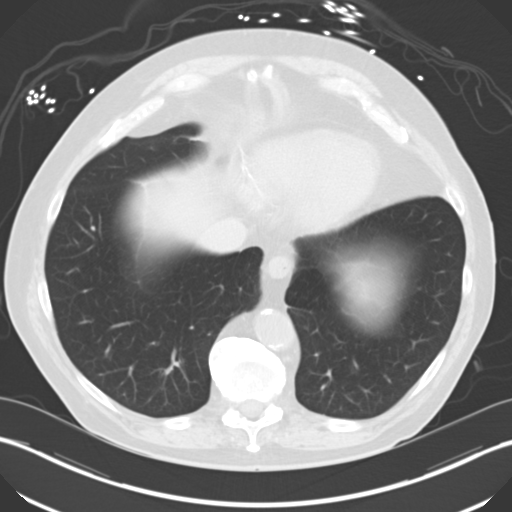

[15 of 32 positions shown; findings below may reference images not displayed]

FINDINGS: A few tiny nodules and calcified granuloma at the lung
bases likely reflect remote granulomatous disease.  Scattered
coronary artery calcifications are seen.

Scattered tiny calcified granulomata are noted within the liver and
spleen.  Additional scattered calcifications are seen adjacent to
the greater curvature of the stomach.  The liver and spleen are
otherwise unremarkable in appearance.  The gallbladder is within
normal limits.  The pancreas and adrenal glands are grossly
unremarkable.

Nonspecific perinephric stranding is noted bilaterally.  The
kidneys are otherwise unremarkable in appearance.  There is no
evidence of hydronephrosis.  No renal or ureteral stones are seen.

No free fluid is identified.  The small bowel is unremarkable in
appearance.  The stomach is within normal limits.  No acute
vascular abnormalities are seen.  Diffuse calcification is noted
along the abdominal aorta and its branches.

The appendix is normal in caliber and contains air, without
evidence for appendicitis.  A single diverticulum is noted along
the ascending colon.  Scattered diverticulosis is noted along the
descending and proximal sigmoid colon.

The bladder is mildly distended and grossly unremarkable in
appearance.  The prostate is borderline normal in size, with
minimal calcification.  A tiny right inguinal hernia is seen,
containing only fat.  No inguinal lymphadenopathy is seen.

No acute osseous abnormalities are identified.  Vacuum phenomenon
is noted at L1-L2.
IMPRESSION: 1.  No acute abnormality seen within the abdomen or pelvis.
2.  Diffuse calcification along the abdominal aorta and its
branches.
3.  Scattered diverticulosis along the descending and proximal
sigmoid colon, without evidence of diverticulitis.
4.  Tiny right inguinal hernia, containing only fat.
5.  Scattered coronary artery calcifications seen.

## 2013-08-02 ENCOUNTER — Other Ambulatory Visit: Payer: Self-pay | Admitting: Internal Medicine

## 2013-08-12 ENCOUNTER — Encounter: Payer: Self-pay | Admitting: Gastroenterology

## 2013-09-23 ENCOUNTER — Ambulatory Visit (INDEPENDENT_AMBULATORY_CARE_PROVIDER_SITE_OTHER): Payer: Medicare Other | Admitting: Internal Medicine

## 2013-09-23 ENCOUNTER — Encounter: Payer: Self-pay | Admitting: Internal Medicine

## 2013-09-23 ENCOUNTER — Other Ambulatory Visit (INDEPENDENT_AMBULATORY_CARE_PROVIDER_SITE_OTHER): Payer: Medicare Other

## 2013-09-23 VITALS — BP 158/76 | HR 82 | Temp 98.0°F | Resp 16 | Ht 74.0 in | Wt 230.0 lb

## 2013-09-23 DIAGNOSIS — E876 Hypokalemia: Secondary | ICD-10-CM

## 2013-09-23 DIAGNOSIS — N182 Chronic kidney disease, stage 2 (mild): Secondary | ICD-10-CM

## 2013-09-23 DIAGNOSIS — E785 Hyperlipidemia, unspecified: Secondary | ICD-10-CM

## 2013-09-23 DIAGNOSIS — I10 Essential (primary) hypertension: Secondary | ICD-10-CM

## 2013-09-23 LAB — BASIC METABOLIC PANEL
BUN: 17 mg/dL (ref 6–23)
CO2: 28 mEq/L (ref 19–32)
Calcium: 9.6 mg/dL (ref 8.4–10.5)
Chloride: 101 mEq/L (ref 96–112)
Creatinine, Ser: 0.9 mg/dL (ref 0.4–1.5)
GFR: 84.36 mL/min (ref >60.00)
Glucose, Bld: 94 mg/dL (ref 70–99)
POTASSIUM: 3.3 meq/L — AB (ref 3.5–5.1)
SODIUM: 138 meq/L (ref 135–145)

## 2013-09-23 LAB — LIPID PANEL
Cholesterol: 162 mg/dL (ref 0–200)
HDL: 74.5 mg/dL (ref >39.00)
LDL CALC: 73 mg/dL (ref 0–99)
Total CHOL/HDL Ratio: 2
Triglycerides: 72 mg/dL (ref 0.0–149.0)
VLDL: 14.4 mg/dL (ref 0.0–40.0)

## 2013-09-23 LAB — MAGNESIUM: Magnesium: 1.9 mg/dL (ref 1.5–2.5)

## 2013-09-23 LAB — TSH: TSH: 0.95 u[IU]/mL (ref 0.35–4.50)

## 2013-09-23 MED ORDER — POTASSIUM CHLORIDE CRYS ER 20 MEQ PO TBCR: EXTENDED_RELEASE_TABLET | ORAL | Status: DC

## 2013-09-23 NOTE — Assessment & Plan Note (Signed)
His K+ level is still 3.3 Will increase the K+ replacement therapy

## 2013-09-23 NOTE — Assessment & Plan Note (Signed)
His BP should be better controlled but he does not want to take another med He agrees to work on his lifestyle modifications

## 2013-09-23 NOTE — Assessment & Plan Note (Signed)
His renal function has improved 

## 2013-09-23 NOTE — Progress Notes (Signed)
Pre visit review using our clinic review tool, if applicable. No additional management support is needed unless otherwise documented below in the visit note. 

## 2013-09-23 NOTE — Progress Notes (Signed)
   Subjective:    Patient ID: [name], male    DOB: October 23, 1940, 73 y.o.   MRN: [id number]  Hypertension This is a chronic problem. The current episode started more than 1 year ago. The problem is unchanged. The problem is controlled. Pertinent negatives include no anxiety, blurred vision, chest pain, headaches, malaise/fatigue, neck pain, orthopnea, palpitations, peripheral edema, PND, shortness of breath or sweats. Risk factors for coronary artery disease include smoking/tobacco exposure. Past treatments include angiotensin blockers and diuretics. The current treatment provides moderate improvement. Compliance problems include diet and exercise.       Review of Systems  Constitutional: Negative.  Negative for fever, chills, malaise/fatigue, diaphoresis, appetite change and fatigue.  HENT: Negative.   Eyes: Negative.  Negative for blurred vision.  Respiratory: Negative.  Negative for cough, choking, chest tightness, shortness of breath and stridor.   Cardiovascular: Negative.  Negative for chest pain, palpitations, orthopnea, leg swelling and PND.  Gastrointestinal: Negative.  Negative for nausea, abdominal pain, diarrhea, constipation and blood in stool.  Endocrine: Negative.   Genitourinary: Negative.   Musculoskeletal: Negative.  Negative for arthralgias, back pain, gait problem, joint swelling, myalgias, neck pain and neck stiffness.  Skin: Negative.   Allergic/Immunologic: Negative.   Neurological: Negative.  Negative for headaches.  Hematological: Negative.  Negative for adenopathy. Does not bruise/bleed easily.  Psychiatric/Behavioral: Negative.        Objective:   Physical Exam  Vitals reviewed. Constitutional: He is oriented to person, place, and time. He appears well-developed and well-nourished. No distress.  HENT:  Head: Normocephalic and atraumatic.  Mouth/Throat: Oropharynx is clear and moist. No oropharyngeal exudate.  Eyes: Conjunctivae are normal. Right eye  exhibits no discharge. Left eye exhibits no discharge. No scleral icterus.  Neck: Normal range of motion. Neck supple. No JVD present. No tracheal deviation present. No thyromegaly present.  Cardiovascular: Normal rate, regular rhythm, normal heart sounds and intact distal pulses.  Exam reveals no gallop and no friction rub.   No murmur heard. Pulmonary/Chest: Effort normal and breath sounds normal. No stridor. No respiratory distress. He has no wheezes. He has no rales. He exhibits no tenderness.  Abdominal: Soft. Bowel sounds are normal. He exhibits no distension and no mass. There is no tenderness. There is no rebound and no guarding.  Musculoskeletal: Normal range of motion. He exhibits no edema and no tenderness.  Lymphadenopathy:    He has no cervical adenopathy.  Neurological: He is oriented to person, place, and time.  Skin: Skin is warm and dry. No rash noted. He is not diaphoretic. No erythema. No pallor.  Psychiatric: He has a normal mood and affect. His behavior is normal. Judgment and thought content normal.     Lab Results  Component Value Date   WBC 7.7 08/08/2012   HGB 14.7 08/08/2012   HCT 41.7 08/08/2012   PLT 168 08/08/2012   GLUCOSE 93 01/08/2013   CHOL 151 07/08/2012   TRIG 78.0 07/08/2012   HDL 53.80 07/08/2012   LDLCALC 82 07/08/2012   ALT 22 08/08/2012   AST 19 08/08/2012   NA 138 01/08/2013   K 3.3* 01/08/2013   CL 99 01/08/2013   CREATININE 1.0 01/08/2013   BUN 13 01/08/2013   CO2 33* 01/08/2013   TSH 1.27 07/08/2012   PSA 1.06 07/08/2012   HGBA1C 5.4 07/08/2012       Assessment & Plan:

## 2013-09-23 NOTE — Patient Instructions (Signed)

## 2013-09-23 NOTE — Assessment & Plan Note (Signed)
He has achieved his LDL goal 

## 2013-09-24 ENCOUNTER — Telehealth: Payer: Self-pay | Admitting: Internal Medicine

## 2013-09-24 NOTE — Telephone Encounter (Signed)
Relevant patient education assigned to patient using Emmi. ° °

## 2013-09-30 ENCOUNTER — Other Ambulatory Visit: Payer: Self-pay | Admitting: Internal Medicine

## 2013-12-25 ENCOUNTER — Other Ambulatory Visit: Payer: Self-pay | Admitting: Internal Medicine

## 2013-12-25 DIAGNOSIS — J449 Chronic obstructive pulmonary disease, unspecified: Secondary | ICD-10-CM

## 2013-12-25 MED ORDER — BUDESONIDE-FORMOTEROL FUMARATE 160-4.5 MCG/ACT IN AERO: 2.0000 | INHALATION_SPRAY | Freq: Two times a day (BID) | RESPIRATORY_TRACT | Status: DC

## 2014-01-12 ENCOUNTER — Encounter: Payer: Self-pay | Admitting: Gastroenterology

## 2014-01-13 ENCOUNTER — Other Ambulatory Visit: Payer: Self-pay | Admitting: Internal Medicine

## 2014-01-28 ENCOUNTER — Encounter: Payer: Self-pay | Admitting: Internal Medicine

## 2014-01-28 ENCOUNTER — Ambulatory Visit (INDEPENDENT_AMBULATORY_CARE_PROVIDER_SITE_OTHER): Payer: Medicare Other | Admitting: Internal Medicine

## 2014-01-28 ENCOUNTER — Other Ambulatory Visit: Payer: Medicare Other

## 2014-01-28 VITALS — BP 130/68 | HR 86 | Temp 98.0°F | Resp 16 | Ht 74.0 in | Wt 215.0 lb

## 2014-01-28 DIAGNOSIS — M150 Primary generalized (osteo)arthritis: Secondary | ICD-10-CM

## 2014-01-28 DIAGNOSIS — L02214 Cutaneous abscess of groin: Secondary | ICD-10-CM

## 2014-01-28 DIAGNOSIS — F17200 Nicotine dependence, unspecified, uncomplicated: Secondary | ICD-10-CM

## 2014-01-28 DIAGNOSIS — L723 Sebaceous cyst: Secondary | ICD-10-CM

## 2014-01-28 DIAGNOSIS — L089 Local infection of the skin and subcutaneous tissue, unspecified: Secondary | ICD-10-CM | POA: Insufficient documentation

## 2014-01-28 DIAGNOSIS — M159 Polyosteoarthritis, unspecified: Secondary | ICD-10-CM

## 2014-01-28 MED ORDER — SULFAMETHOXAZOLE-TMP DS 800-160 MG PO TABS: 1.0000 | ORAL_TABLET | Freq: Two times a day (BID) | ORAL | Status: DC

## 2014-01-28 NOTE — Patient Instructions (Signed)
Incision and Drainage Incision and drainage is a procedure in which a sac-like structure (cystic structure) is opened and drained. The area to be drained usually contains material such as pus, fluid, or blood.  LET YOUR CAREGIVER KNOW ABOUT:   Allergies to medicine.  Medicines taken, including vitamins, herbs, eyedrops, over-the-counter medicines, and creams.  Use of steroids (by mouth or creams).  Previous problems with anesthetics or numbing medicines.  History of bleeding problems or blood clots.  Previous surgery.  Other health problems, including diabetes and kidney problems.  Possibility of pregnancy, if this applies. RISKS AND COMPLICATIONS  Pain.  Bleeding.  Scarring.  Infection. BEFORE THE PROCEDURE  You may need to have an ultrasound or other imaging tests to see how large or deep your cystic structure is. Blood tests may also be used to determine if you have an infection or how severe the infection is. You may need to have a tetanus shot. PROCEDURE  The affected area is cleaned with a cleaning fluid. The cyst area will then be numbed with a medicine (local anesthetic). A small incision will be made in the cystic structure. A syringe or catheter may be used to drain the contents of the cystic structure, or the contents may be squeezed out. The area will then be flushed with a cleansing solution. After cleansing the area, it is often gently packed with a gauze or another wound dressing. Once it is packed, it will be covered with gauze and tape or some other type of wound dressing. AFTER THE PROCEDURE   Often, you will be allowed to go home right after the procedure.  You may be given antibiotic medicine to prevent or heal an infection.  If the area was packed with gauze or some other wound dressing, you will likely need to come back in 1 to 2 days to get it removed.  The area should heal in about 14 days. Document Released: 10/04/2000 Document Revised: 10/10/2011  Document Reviewed: 06/05/2011 [employer] Patient Information 2015 [employer]. This information is not intended to replace advice given to you by your health care provider. Make sure you discuss any questions you have with your health care provider.  

## 2014-01-28 NOTE — Progress Notes (Signed)
Pre visit review using our clinic review tool, if applicable. No additional management support is needed unless otherwise documented below in the visit note. 

## 2014-01-28 NOTE — Assessment & Plan Note (Signed)
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

## 2014-01-28 NOTE — Progress Notes (Signed)
Subjective:    Patient ID: [name], male    DOB: April 10, 1941, 73 y.o.   MRN: [id number]  HPI Comments: He complains of a one week history of an enlarging, painful cyst in his right groin/perineum adjacent to the right upper scrotum.     Review of Systems  Constitutional: Negative for fever, chills, diaphoresis, appetite change and fatigue.  HENT: Negative.   Eyes: Negative.   Respiratory: Negative.   Cardiovascular: Negative.  Negative for chest pain, palpitations and leg swelling.  Gastrointestinal: Negative.  Negative for abdominal pain.  Endocrine: Negative.   Genitourinary: Negative.  Negative for dysuria, urgency, frequency, hematuria, flank pain, decreased urine volume, discharge, penile swelling, scrotal swelling, enuresis, difficulty urinating, genital sores, penile pain and testicular pain.  Musculoskeletal: Negative.   Skin: Negative.  Negative for color change, pallor, rash and wound.  Allergic/Immunologic: Negative.   Neurological: Negative.   Hematological: Negative.  Negative for adenopathy. Does not bruise/bleed easily.  Psychiatric/Behavioral: Negative.        Objective:   Physical Exam  Vitals reviewed. Constitutional: He appears well-developed and well-nourished. No distress.  HENT:  Head: Normocephalic and atraumatic.  Mouth/Throat: Oropharynx is clear and moist. No oropharyngeal exudate.  Eyes: Conjunctivae are normal. Right eye exhibits no discharge. Left eye exhibits no discharge. No scleral icterus.  Neck: Normal range of motion. Neck supple. No JVD present. No tracheal deviation present. No thyromegaly present.  Cardiovascular: Normal rate, regular rhythm, normal heart sounds and intact distal pulses.  Exam reveals no gallop and no friction rub.   No murmur heard. Pulmonary/Chest: Effort normal and breath sounds normal. No stridor. No respiratory distress. He has no wheezes. He has no rales. He exhibits no tenderness.  Abdominal: Soft. Bowel sounds  are normal. He exhibits no distension and no mass. There is no tenderness. There is no rebound and no guarding. Hernia confirmed negative in the right inguinal area and confirmed negative in the left inguinal area.  Genitourinary: Penis normal.    Right testis shows no mass, no swelling and no tenderness. Right testis is descended. Left testis shows no mass, no swelling and no tenderness. Left testis is descended. Circumcised. No penile erythema or penile tenderness. No discharge found.  The area of concern was cleaned with betadine then prepped and draped in sterile fashion, local anesthesia was obtained with 2% lido with epi, 1.5 cc's were used. Next a 4 mm punch incision was made and the contents of a sebaceous cyst with scant exudate was seen, a culture was collected and sent. No loculations were found. The cavity was irrigated with H2O2/Qtips then it was packed with iodoform. He tolerated the procedure well with no blood loss of complications. A dressing was applied.  Lymphadenopathy:    He has no cervical adenopathy.       Right: No inguinal adenopathy present.       Left: No inguinal adenopathy present.  Skin: Skin is warm and dry. No rash noted. He is not diaphoretic. No erythema. No pallor.     Lab Results  Component Value Date   WBC 7.7 08/08/2012   HGB 14.7 08/08/2012   HCT 41.7 08/08/2012   PLT 168 08/08/2012   GLUCOSE 94 09/23/2013   CHOL 162 09/23/2013   TRIG 72.0 09/23/2013   HDL 74.50 09/23/2013   LDLCALC 73 09/23/2013   ALT 22 08/08/2012   AST 19 08/08/2012   NA 138 09/23/2013   K 3.3* 09/23/2013   CL 101 09/23/2013  CREATININE 0.9 09/23/2013   BUN 17 09/23/2013   CO2 28 09/23/2013   TSH 0.95 09/23/2013   PSA 1.06 07/08/2012   HGBA1C 5.4 07/08/2012       Assessment & Plan:

## 2014-01-29 ENCOUNTER — Encounter: Payer: Self-pay | Admitting: Internal Medicine

## 2014-01-29 ENCOUNTER — Ambulatory Visit (INDEPENDENT_AMBULATORY_CARE_PROVIDER_SITE_OTHER): Payer: Medicare Other | Admitting: Internal Medicine

## 2014-01-29 ENCOUNTER — Telehealth: Payer: Self-pay | Admitting: Internal Medicine

## 2014-01-29 VITALS — BP 136/70 | HR 68 | Temp 97.8°F | Resp 16 | Ht 74.0 in | Wt 215.0 lb

## 2014-01-29 DIAGNOSIS — L02214 Cutaneous abscess of groin: Secondary | ICD-10-CM

## 2014-01-29 NOTE — Telephone Encounter (Signed)
emmi emailed °

## 2014-01-29 NOTE — Progress Notes (Signed)
   Subjective:    Patient ID: [name], male    DOB: 1941/02/29, 73 y.o.   MRN: [id number]  Wound Check He was originally treated yesterday. Previous treatment included I&D of abscess. His temperature was unmeasured prior to arrival. There has been no drainage from the wound. There is no redness present. There is no swelling present. The pain has no pain. He has no difficulty moving the affected extremity or digit.      Review of Systems  All other systems reviewed and are negative.      Objective:   Physical Exam  Genitourinary:             Assessment & Plan:

## 2014-01-29 NOTE — Assessment & Plan Note (Signed)
The clx is negative, will stop the bactrim Will recheck the area in 2 weeks, it the cyst has not resolved will consider referral to [facility] for possible biopsy

## 2014-01-29 NOTE — Patient Instructions (Signed)

## 2014-01-31 LAB — WOUND CULTURE
GRAM STAIN: NONE SEEN
Gram Stain: NONE SEEN

## 2014-02-06 ENCOUNTER — Other Ambulatory Visit: Payer: Self-pay

## 2014-02-12 ENCOUNTER — Ambulatory Visit (INDEPENDENT_AMBULATORY_CARE_PROVIDER_SITE_OTHER): Payer: Medicare Other | Admitting: Internal Medicine

## 2014-02-12 ENCOUNTER — Encounter: Payer: Self-pay | Admitting: Internal Medicine

## 2014-02-12 VITALS — BP 142/78 | HR 75 | Temp 97.7°F | Resp 16 | Ht 74.0 in | Wt 216.0 lb

## 2014-02-12 DIAGNOSIS — L089 Local infection of the skin and subcutaneous tissue, unspecified: Secondary | ICD-10-CM

## 2014-02-12 DIAGNOSIS — L723 Sebaceous cyst: Secondary | ICD-10-CM

## 2014-02-12 NOTE — Progress Notes (Signed)
   Subjective:    Patient ID: [name], male    DOB: Sep 22, 1940, 73 y.o.   MRN: [id number]  Wound Check He was originally treated 5 to 10 days ago. Previous treatment included I&D of abscess. His temperature was unmeasured prior to arrival. There has been no drainage from the wound. There is no redness present. There is no swelling present. The pain has no pain. He has no difficulty moving the affected extremity or digit.      Review of Systems  All other systems reviewed and are negative.      Objective:   Physical Exam  Genitourinary:             Assessment & Plan:

## 2014-02-12 NOTE — Progress Notes (Signed)
Pre visit review using our clinic review tool, if applicable. No additional management support is needed unless otherwise documented below in the visit note. 

## 2014-02-13 ENCOUNTER — Encounter: Payer: Self-pay | Admitting: Internal Medicine

## 2014-02-13 NOTE — Assessment & Plan Note (Signed)
Resolution noted He will let me know if he develops any new symptoms

## 2014-02-23 ENCOUNTER — Other Ambulatory Visit: Payer: Self-pay | Admitting: Internal Medicine

## 2014-03-25 ENCOUNTER — Encounter: Payer: Self-pay | Admitting: Internal Medicine

## 2014-03-25 ENCOUNTER — Other Ambulatory Visit (INDEPENDENT_AMBULATORY_CARE_PROVIDER_SITE_OTHER): Payer: Medicare Other

## 2014-03-25 ENCOUNTER — Ambulatory Visit (INDEPENDENT_AMBULATORY_CARE_PROVIDER_SITE_OTHER): Payer: Medicare Other | Admitting: Internal Medicine

## 2014-03-25 VITALS — BP 122/60 | HR 71 | Temp 98.2°F | Resp 16 | Ht 74.0 in | Wt 216.0 lb

## 2014-03-25 DIAGNOSIS — R739 Hyperglycemia, unspecified: Secondary | ICD-10-CM

## 2014-03-25 DIAGNOSIS — Z0000 Encounter for general adult medical examination without abnormal findings: Secondary | ICD-10-CM

## 2014-03-25 DIAGNOSIS — E785 Hyperlipidemia, unspecified: Secondary | ICD-10-CM

## 2014-03-25 DIAGNOSIS — I10 Essential (primary) hypertension: Secondary | ICD-10-CM

## 2014-03-25 DIAGNOSIS — N400 Enlarged prostate without lower urinary tract symptoms: Secondary | ICD-10-CM

## 2014-03-25 LAB — CBC WITH DIFFERENTIAL/PLATELET
BASOS ABS: 0 10*3/uL (ref 0.0–0.1)
Basophils Relative: 0.4 % (ref 0.0–3.0)
EOS ABS: 0.1 10*3/uL (ref 0.0–0.7)
Eosinophils Relative: 2.7 % (ref 0.0–5.0)
HCT: 48.4 % (ref 39.0–52.0)
HEMOGLOBIN: 16.3 g/dL (ref 13.0–17.0)
LYMPHS PCT: 23.5 % (ref 12.0–46.0)
Lymphs Abs: 1.3 10*3/uL (ref 0.7–4.0)
MCHC: 33.7 g/dL (ref 30.0–36.0)
MCV: 95.6 fl (ref 78.0–100.0)
MONOS PCT: 8.8 % (ref 3.0–12.0)
Monocytes Absolute: 0.5 10*3/uL (ref 0.1–1.0)
NEUTROS PCT: 64.6 % (ref 43.0–77.0)
Neutro Abs: 3.6 10*3/uL (ref 1.4–7.7)
Platelets: 191 10*3/uL (ref 150.0–400.0)
RBC: 5.06 Mil/uL (ref 4.22–5.81)
RDW: 12.9 % (ref 11.5–15.5)
WBC: 5.6 10*3/uL (ref 4.0–10.5)

## 2014-03-25 LAB — COMPREHENSIVE METABOLIC PANEL
ALT: 33 U/L (ref 0–53)
AST: 27 U/L (ref 0–37)
Albumin: 4.1 g/dL (ref 3.5–5.2)
Alkaline Phosphatase: 39 U/L (ref 39–117)
BUN: 15 mg/dL (ref 6–23)
CO2: 30 meq/L (ref 19–32)
CREATININE: 1 mg/dL (ref 0.4–1.5)
Calcium: 10.3 mg/dL (ref 8.4–10.5)
Chloride: 103 mEq/L (ref 96–112)
GFR: 74.05 mL/min (ref >60.00)
Glucose, Bld: 95 mg/dL (ref 70–99)
POTASSIUM: 4.1 meq/L (ref 3.5–5.1)
Sodium: 140 mEq/L (ref 135–145)
Total Bilirubin: 1.2 mg/dL (ref 0.2–1.2)
Total Protein: 6.2 g/dL (ref 6.0–8.3)

## 2014-03-25 LAB — LIPID PANEL
CHOLESTEROL: 162 mg/dL (ref 0–200)
HDL: 72.9 mg/dL (ref >39.00)
LDL Cholesterol: 80 mg/dL (ref 0–99)
NonHDL: 89.1
TRIGLYCERIDES: 44 mg/dL (ref 0.0–149.0)
Total CHOL/HDL Ratio: 2
VLDL: 8.8 mg/dL (ref 0.0–40.0)

## 2014-03-25 LAB — HEMOGLOBIN A1C: HEMOGLOBIN A1C: 5.6 % (ref 4.6–6.5)

## 2014-03-25 LAB — PSA: PSA: 1.36 ng/mL (ref 0.10–4.00)

## 2014-03-25 LAB — TSH: TSH: 1 u[IU]/mL (ref 0.35–4.50)

## 2014-03-25 LAB — FECAL OCCULT BLOOD, GUAIAC: FECAL OCCULT BLD: NEGATIVE

## 2014-03-25 NOTE — Progress Notes (Signed)
Subjective:    Patient ID: [name], male    DOB: [birth year], 73 y.o.   MRN: [id number]  Hypertension This is a chronic problem. The current episode started more than 1 year ago. The problem is unchanged. The problem is controlled. Pertinent negatives include no anxiety, blurred vision, chest pain, headaches, malaise/fatigue, neck pain, orthopnea, palpitations, peripheral edema, PND, shortness of breath or sweats. There are no associated agents to hypertension. Past treatments include diuretics and angiotensin blockers. The current treatment provides significant improvement. Compliance problems include diet and exercise.       Review of Systems  Constitutional: Negative.  Negative for fever, chills, malaise/fatigue, diaphoresis, appetite change and fatigue.  HENT: Negative.   Eyes: Negative.  Negative for blurred vision.  Respiratory: Negative.  Negative for cough, choking, chest tightness, shortness of breath and stridor.   Cardiovascular: Negative.  Negative for chest pain, palpitations, orthopnea, leg swelling and PND.  Gastrointestinal: Negative.  Negative for nausea, vomiting, abdominal pain, diarrhea, constipation and blood in stool.  Endocrine: Negative.   Genitourinary: Negative.   Musculoskeletal: Negative.  Negative for back pain, arthralgias and neck pain.  Skin: Negative.  Negative for rash.  Allergic/Immunologic: Negative.   Neurological: Negative.  Negative for dizziness and headaches.  Hematological: Negative.  Negative for adenopathy. Does not bruise/bleed easily.  Psychiatric/Behavioral: Negative.        Objective:   Physical Exam  Constitutional: He is oriented to person, place, and time. He appears well-developed and well-nourished. No distress.  HENT:  Head: Normocephalic and atraumatic.  Mouth/Throat: Oropharynx is clear and moist. No oropharyngeal exudate.  Eyes: Conjunctivae are normal. Right eye exhibits no discharge. Left eye exhibits no discharge. No  scleral icterus.  Neck: Normal range of motion. Neck supple. No JVD present. No tracheal deviation present. No thyromegaly present.  Cardiovascular: Normal rate, regular rhythm, normal heart sounds and intact distal pulses.  Exam reveals no gallop and no friction rub.   No murmur heard. Pulmonary/Chest: Effort normal. No accessory muscle usage or stridor. No respiratory distress. He has no decreased breath sounds. He has wheezes in the left middle field. He has rhonchi in the right lower field. He has no rales. He exhibits no tenderness.  Abdominal: Soft. Bowel sounds are normal. He exhibits no distension and no mass. There is no tenderness. There is no rebound and no guarding. Hernia confirmed negative in the right inguinal area and confirmed negative in the left inguinal area.  Genitourinary: Testes normal and penis normal. Rectal exam shows external hemorrhoid and internal hemorrhoid. Rectal exam shows no fissure, no mass, no tenderness and anal tone normal. Guaiac negative stool. Prostate is enlarged (1+ smooth symm BPH). Prostate is not tender. Right testis shows no mass, no swelling and no tenderness. Right testis is descended. Left testis shows no mass, no swelling and no tenderness. Left testis is descended. Circumcised. No penile erythema or penile tenderness. No discharge found.  Musculoskeletal: Normal range of motion. He exhibits no edema or tenderness.  Lymphadenopathy:    He has no cervical adenopathy.       Right: No inguinal adenopathy present.       Left: No inguinal adenopathy present.  Neurological: He is oriented to person, place, and time.  Skin: Skin is warm and dry. No rash noted. He is not diaphoretic. No erythema. No pallor.  Psychiatric: He has a normal mood and affect. His behavior is normal. Judgment and thought content normal.  Vitals reviewed.    Lab  Results  Component Value Date   WBC 5.6 03/25/2014   HGB 16.3 03/25/2014   HCT 48.4 03/25/2014   PLT 191.0  03/25/2014   GLUCOSE 95 03/25/2014   CHOL 162 03/25/2014   TRIG 44.0 03/25/2014   HDL 72.90 03/25/2014   LDLCALC 80 03/25/2014   ALT 33 03/25/2014   AST 27 03/25/2014   NA 140 03/25/2014   K 4.1 03/25/2014   CL 103 03/25/2014   CREATININE 1.0 03/25/2014   BUN 15 03/25/2014   CO2 30 03/25/2014   TSH 1.00 03/25/2014   PSA 1.36 03/25/2014   HGBA1C 5.6 03/25/2014       Assessment & Plan:

## 2014-03-25 NOTE — Progress Notes (Signed)
Pre visit review using our clinic review tool, if applicable. No additional management support is needed unless otherwise documented below in the visit note. 

## 2014-03-25 NOTE — Patient Instructions (Signed)
Health Maintenance A healthy lifestyle and preventative care can promote health and wellness.  Maintain regular health, dental, and eye exams.  Eat a healthy diet. Foods like vegetables, fruits, whole grains, low-fat dairy products, and lean protein foods contain the nutrients you need and are low in calories. Decrease your intake of foods high in solid fats, added sugars, and salt. Get information about a proper diet from your health care provider, if necessary.  Regular physical exercise is one of the most important things you can do for your health. Most adults should get at least 150 minutes of moderate-intensity exercise (any activity that increases your heart rate and causes you to sweat) each week. In addition, most adults need muscle-strengthening exercises on 2 or more days a week.   Maintain a healthy weight. The body mass index (BMI) is a screening tool to identify possible weight problems. It provides an estimate of body fat based on height and weight. Your health care provider can find your BMI and can help you achieve or maintain a healthy weight. For males 20 years and older:  A BMI below 18.5 is considered underweight.  A BMI of 18.5 to 24.9 is normal.  A BMI of 25 to 29.9 is considered overweight.  A BMI of 30 and above is considered obese.  Maintain normal blood lipids and cholesterol by exercising and minimizing your intake of saturated fat. Eat a balanced diet with plenty of fruits and vegetables. Blood tests for lipids and cholesterol should begin at age 20 and be repeated every 5 years. If your lipid or cholesterol levels are high, you are over age 50, or you are at high risk for heart disease, you may need your cholesterol levels checked more frequently.Ongoing high lipid and cholesterol levels should be treated with medicines if diet and exercise are not working.  If you smoke, find out from your health care provider how to quit. If you do not use tobacco, do not  start.  Lung cancer screening is recommended for adults aged 55-80 years who are at high risk for developing lung cancer because of a history of smoking. A yearly low-dose CT scan of the lungs is recommended for people who have at least a 30-pack-year history of smoking and are current smokers or have quit within the past 15 years. A pack year of smoking is smoking an average of 1 pack of cigarettes a day for 1 year (for example, a 30-pack-year history of smoking could mean smoking 1 pack a day for 30 years or 2 packs a day for 15 years). Yearly screening should continue until the smoker has stopped smoking for at least 15 years. Yearly screening should be stopped for people who develop a health problem that would prevent them from having lung cancer treatment.  If you choose to drink alcohol, do not have more than 2 drinks per day. One drink is considered to be 12 oz (360 mL) of beer, 5 oz (150 mL) of wine, or 1.5 oz (45 mL) of liquor.  Avoid the use of street drugs. Do not share needles with anyone. Ask for help if you need support or instructions about stopping the use of drugs.  High blood pressure causes heart disease and increases the risk of stroke. Blood pressure should be checked at least every 1-2 years. Ongoing high blood pressure should be treated with medicines if weight loss and exercise are not effective.  If you are 45-79 years old, ask your health care provider if   you should take aspirin to prevent heart disease.  Diabetes screening involves taking a blood sample to check your fasting blood sugar level. This should be done once every 3 years after age 45 if you are at a normal weight and without risk factors for diabetes. Testing should be considered at a younger age or be carried out more frequently if you are overweight and have at least 1 risk factor for diabetes.  Colorectal cancer can be detected and often prevented. Most routine colorectal cancer screening begins at the age of 50  and continues through age 75. However, your health care provider may recommend screening at an earlier age if you have risk factors for colon cancer. On a yearly basis, your health care provider may provide home test kits to check for hidden blood in the stool. A small camera at the end of a tube may be used to directly examine the colon (sigmoidoscopy or colonoscopy) to detect the earliest forms of colorectal cancer. Talk to your health care provider about this at age 50 when routine screening begins. A direct exam of the colon should be repeated every 5-10 years through age 75, unless early forms of precancerous polyps or small growths are found.  People who are at an increased risk for hepatitis B should be screened for this virus. You are considered at high risk for hepatitis B if:  You were born in a country where hepatitis B occurs often. Talk with your health care provider about which countries are considered high risk.  Your parents were born in a high-risk country and you have not received a shot to protect against hepatitis B (hepatitis B vaccine).  You have HIV or AIDS.  You use needles to inject street drugs.  You live with, or have sex with, someone who has hepatitis B.  You are a man who has sex with other men (MSM).  You get hemodialysis treatment.  You take certain medicines for conditions like cancer, organ transplantation, and autoimmune conditions.  Hepatitis C blood testing is recommended for all people born from 1945 through 1965 and any individual with known risk factors for hepatitis C.  Healthy men should no longer receive prostate-specific antigen (PSA) blood tests as part of routine cancer screening. Talk to your health care provider about prostate cancer screening.  Testicular cancer screening is not recommended for adolescents or adult males who have no symptoms. Screening includes self-exam, a health care provider exam, and other screening tests. Consult with your  health care provider about any symptoms you have or any concerns you have about testicular cancer.  Practice safe sex. Use condoms and avoid high-risk sexual practices to reduce the spread of sexually transmitted infections (STIs).  You should be screened for STIs, including gonorrhea and chlamydia if:  You are sexually active and are younger than 24 years.  You are older than 24 years, and your health care provider tells you that you are at risk for this type of infection.  Your sexual activity has changed since you were last screened, and you are at an increased risk for chlamydia or gonorrhea. Ask your health care provider if you are at risk.  If you are at risk of being infected with HIV, it is recommended that you take a prescription medicine daily to prevent HIV infection. This is called pre-exposure prophylaxis (PrEP). You are considered at risk if:  You are a man who has sex with other men (MSM).  You are a heterosexual man who   is sexually active with multiple partners.  You take drugs by injection.  You are sexually active with a partner who has HIV.  Talk with your health care provider about whether you are at high risk of being infected with HIV. If you choose to begin PrEP, you should first be tested for HIV. You should then be tested every 3 months for as long as you are taking PrEP.  Use sunscreen. Apply sunscreen liberally and repeatedly throughout the day. You should seek shade when your shadow is shorter than you. Protect yourself by wearing long sleeves, pants, a wide-brimmed hat, and sunglasses year round whenever you are outdoors.  Tell your health care provider of new moles or changes in moles, especially if there is a change in shape or color. Also, tell your health care provider if a mole is larger than the size of a pencil eraser.  A one-time screening for abdominal aortic aneurysm (AAA) and surgical repair of large AAAs by ultrasound is recommended for men aged  65-75 years who are current or former smokers.  Stay current with your vaccines (immunizations). Document Released: 10/07/2007 Document Revised: 04/15/2013 Document Reviewed: 09/05/2010 Exit[employer] Patient Information 2015 [employer]. This information is not intended to replace advice given to you by your health care provider. Make sure you discuss any questions you have with your health care provider.  Hypertension Hypertension, commonly called high blood pressure, is when the force of blood pumping through your arteries is too strong. Your arteries are the blood vessels that carry blood from your heart throughout your body. A blood pressure reading consists of a higher number over a lower number, such as 110/72. The higher number (systolic) is the pressure inside your arteries when your heart pumps. The lower number (diastolic) is the pressure inside your arteries when your heart relaxes. Ideally you want your blood pressure below 120/80. Hypertension forces your heart to work harder to pump blood. Your arteries may become narrow or stiff. Having hypertension puts you at risk for heart disease, stroke, and other problems.  RISK FACTORS Some risk factors for high blood pressure are controllable. Others are not.  Risk factors you cannot control include:   Race. You may be at higher risk if you are African American.  Age. Risk increases with age.  Gender. Men are at higher risk than women before age 45 years. After age 65, women are at higher risk than men. Risk factors you can control include:  Not getting enough exercise or physical activity.  Being overweight.  Getting too much fat, sugar, calories, or salt in your diet.  Drinking too much alcohol. SIGNS AND SYMPTOMS Hypertension does not usually cause signs or symptoms. Extremely high blood pressure (hypertensive crisis) may cause headache, anxiety, shortness of breath, and nosebleed. DIAGNOSIS  To check if you have hypertension,  your health care provider will measure your blood pressure while you are seated, with your arm held at the level of your heart. It should be measured at least twice using the same arm. Certain conditions can cause a difference in blood pressure between your right and left arms. A blood pressure reading that is higher than normal on one occasion does not mean that you need treatment. If one blood pressure reading is high, ask your health care provider about having it checked again. TREATMENT  Treating high blood pressure includes making lifestyle changes and possibly taking medicine. Living a healthy lifestyle can help lower high blood pressure. You may need to change   some of your habits. Lifestyle changes may include:  Following the DASH diet. This diet is high in fruits, vegetables, and whole grains. It is low in salt, red meat, and added sugars.  Getting at least 2 hours of brisk physical activity every week.  Losing weight if necessary.  Not smoking.  Limiting alcoholic beverages.  Learning ways to reduce stress. If lifestyle changes are not enough to get your blood pressure under control, your health care provider may prescribe medicine. You may need to take more than one. Work closely with your health care provider to understand the risks and benefits. HOME CARE INSTRUCTIONS  Have your blood pressure rechecked as directed by your health care provider.   Take medicines only as directed by your health care provider. Follow the directions carefully. Blood pressure medicines must be taken as prescribed. The medicine does not work as well when you skip doses. Skipping doses also puts you at risk for problems.   Do not smoke.   Monitor your blood pressure at home as directed by your health care provider. SEEK MEDICAL CARE IF:   You think you are having a reaction to medicines taken.  You have recurrent headaches or feel dizzy.  You have swelling in your ankles.  You have  trouble with your vision. SEEK IMMEDIATE MEDICAL CARE IF:  You develop a severe headache or confusion.  You have unusual weakness, numbness, or feel faint.  You have severe chest or abdominal pain.  You vomit repeatedly.  You have trouble breathing. MAKE SURE YOU:   Understand these instructions.  Will watch your condition.  Will get help right away if you are not doing well or get worse. Document Released: 04/10/2005 Document Revised: 08/25/2013 Document Reviewed: 01/31/2013 Exit[employer] Patient Information 2015 [employer]. This information is not intended to replace advice given to you by your health care provider. Make sure you discuss any questions you have with your health care provider.  

## 2014-03-26 NOTE — Assessment & Plan Note (Signed)
His BP is well controlled 

## 2014-03-26 NOTE — Assessment & Plan Note (Signed)
He has achieved his LDL goal 

## 2014-03-26 NOTE — Assessment & Plan Note (Signed)

## 2014-03-26 NOTE — Assessment & Plan Note (Signed)
He has no s/s that need to be treated Will check his PSA to screen for cancer

## 2014-04-27 ENCOUNTER — Telehealth: Payer: Self-pay | Admitting: Internal Medicine

## 2014-04-27 ENCOUNTER — Other Ambulatory Visit: Payer: Self-pay | Admitting: *Deleted

## 2014-04-27 DIAGNOSIS — R062 Wheezing: Secondary | ICD-10-CM

## 2014-04-27 DIAGNOSIS — J441 Chronic obstructive pulmonary disease with (acute) exacerbation: Secondary | ICD-10-CM

## 2014-04-27 MED ORDER — ALBUTEROL SULFATE HFA 108 (90 BASE) MCG/ACT IN AERS: 2.0000 | INHALATION_SPRAY | Freq: Four times a day (QID) | RESPIRATORY_TRACT | Status: DC | PRN

## 2014-04-27 NOTE — Telephone Encounter (Signed)
Patient is requesting new script for albuterol to be sent to [facility]

## 2014-08-19 ENCOUNTER — Other Ambulatory Visit: Payer: Self-pay | Admitting: Internal Medicine

## 2014-09-24 ENCOUNTER — Encounter: Payer: Self-pay | Admitting: Internal Medicine

## 2014-09-24 ENCOUNTER — Ambulatory Visit (INDEPENDENT_AMBULATORY_CARE_PROVIDER_SITE_OTHER): Payer: PPO | Admitting: Internal Medicine

## 2014-09-24 ENCOUNTER — Ambulatory Visit (INDEPENDENT_AMBULATORY_CARE_PROVIDER_SITE_OTHER)
Admission: RE | Admit: 2014-09-24 | Discharge: 2014-09-24 | Disposition: A | Discharge status: Home / Self Care | Payer: PPO | Source: Ambulatory Visit | Attending: Internal Medicine | Admitting: Internal Medicine

## 2014-09-24 ENCOUNTER — Other Ambulatory Visit (INDEPENDENT_AMBULATORY_CARE_PROVIDER_SITE_OTHER): Payer: PPO

## 2014-09-24 VITALS — BP 132/80 | HR 72 | Temp 98.1°F | Resp 14 | Ht 74.0 in | Wt 208.8 lb

## 2014-09-24 DIAGNOSIS — N189 Chronic kidney disease, unspecified: Secondary | ICD-10-CM

## 2014-09-24 DIAGNOSIS — I10 Essential (primary) hypertension: Secondary | ICD-10-CM

## 2014-09-24 DIAGNOSIS — N182 Chronic kidney disease, stage 2 (mild): Secondary | ICD-10-CM

## 2014-09-24 DIAGNOSIS — R911 Solitary pulmonary nodule: Secondary | ICD-10-CM

## 2014-09-24 LAB — BASIC METABOLIC PANEL
BUN: 14 mg/dL (ref 6–23)
CO2: 33 mEq/L — ABNORMAL HIGH (ref 19–32)
Calcium: 9.8 mg/dL (ref 8.4–10.5)
Chloride: 101 mEq/L (ref 96–112)
Creatinine, Ser: 0.91 mg/dL (ref 0.40–1.50)
GFR: 86.27 mL/min (ref >60.00)
GLUCOSE: 88 mg/dL (ref 70–99)
Potassium: 4.1 mEq/L (ref 3.5–5.1)
Sodium: 137 mEq/L (ref 135–145)

## 2014-09-24 LAB — CBC WITH DIFFERENTIAL/PLATELET
Basophils Absolute: 0 10*3/uL (ref 0.0–0.1)
Basophils Relative: 0.6 % (ref 0.0–3.0)
EOS PCT: 4.1 % (ref 0.0–5.0)
Eosinophils Absolute: 0.2 10*3/uL (ref 0.0–0.7)
HEMATOCRIT: 47.9 % (ref 39.0–52.0)
HEMOGLOBIN: 16.4 g/dL (ref 13.0–17.0)
LYMPHS ABS: 1.4 10*3/uL (ref 0.7–4.0)
LYMPHS PCT: 23.6 % (ref 12.0–46.0)
MCHC: 34.2 g/dL (ref 30.0–36.0)
MCV: 94.6 fl (ref 78.0–100.0)
MONO ABS: 0.6 10*3/uL (ref 0.1–1.0)
MONOS PCT: 9.7 % (ref 3.0–12.0)
NEUTROS ABS: 3.7 10*3/uL (ref 1.4–7.7)
NEUTROS PCT: 62 % (ref 43.0–77.0)
PLATELETS: 189 10*3/uL (ref 150.0–400.0)
RBC: 5.06 Mil/uL (ref 4.22–5.81)
RDW: 13.6 % (ref 11.5–15.5)
WBC: 6 10*3/uL (ref 4.0–10.5)

## 2014-09-24 NOTE — Progress Notes (Signed)
Subjective:  Patient ID: [name], male    DOB: 1940-08-26  Age: 74 y.o. MRN: [id number]  CC: Hypertension   HPI [name] presents for a BP check, he reports a mild, intermittent NP cough but this is not a new symptom for him. He otherwise feels well and offers no complaints.  Outpatient Prescriptions Prior to Visit  Medication Sig Dispense Refill  . albuterol (PROVENTIL HFA;VENTOLIN HFA) 108 (90 BASE) MCG/ACT inhaler Inhale 2 puffs into the lungs every 6 (six) hours as needed. 1 Inhaler 11  . aspirin 81 MG tablet Take 81 mg by mouth daily.      Marland Kitchen atorvastatin (LIPITOR) 80 MG tablet TAKE 1 TABLET BY MOUTH ONCE DAILY 90 tablet 2  . budesonide-formoterol (SYMBICORT) 160-4.5 MCG/ACT inhaler Inhale 2 puffs into the lungs 2 (two) times daily. 10.2 g 11  . chlorthalidone (HYGROTON) 25 MG tablet TAKE 1 TABLET BY MOUTH DAILY 90 tablet 1  . potassium chloride SA (K-DUR,KLOR-CON) 20 MEQ tablet TAKE 1 TABLET BY MOUTH 3 TIMES DAILY 90 tablet 5  . tiotropium (SPIRIVA HANDIHALER) 18 MCG inhalation capsule Place 1 capsule (18 mcg total) into inhaler and inhale daily. 30 capsule 11  . traZODone (DESYREL) 50 MG tablet Take 1 tablet (50 mg total) by mouth at bedtime as needed for sleep. 90 tablet 1  . zolpidem (AMBIEN) 5 MG tablet TAKE ONE TABLET BY MOUTH AT BEDTIME AS NEEDED FOR SLEEP 30 tablet 2   No facility-administered medications prior to visit.    ROS Review of Systems  Constitutional: Negative.  Negative for fever, chills, diaphoresis, appetite change, fatigue and unexpected weight change.  HENT: Negative.  Negative for trouble swallowing and voice change.   Eyes: Negative.   Respiratory: Positive for cough. Negative for apnea, choking, chest tightness, shortness of breath, wheezing and stridor.   Cardiovascular: Negative.  Negative for chest pain, palpitations and leg swelling.  Gastrointestinal: Negative.  Negative for nausea, vomiting, abdominal pain, diarrhea, constipation and blood  in stool.  Endocrine: Negative.   Genitourinary: Negative.  Negative for difficulty urinating.  Musculoskeletal: Negative.   Skin: Negative.   Allergic/Immunologic: Negative.   Neurological: Negative.  Negative for dizziness, tremors, syncope and numbness.  Hematological: Negative.  Negative for adenopathy. Does not bruise/bleed easily.  Psychiatric/Behavioral: Negative.     Objective:  BP 132/80 mmHg  Pulse 72  Temp(Src) 98.1 F (36.7 C) (Oral)  Resp 14  Ht 6\' 2"  (1.88 m)  Wt 208 lb 12 oz (94.688 kg)  BMI 26.79 kg/m2  SpO2 98%  BP Readings from Last 3 Encounters:  09/24/14 132/80  03/25/14 122/60  02/12/14 142/78    Wt Readings from Last 3 Encounters:  09/24/14 208 lb 12 oz (94.688 kg)  03/25/14 216 lb (97.977 kg)  02/12/14 216 lb (97.977 kg)    Physical Exam  Constitutional: He is oriented to person, place, and time. He appears well-developed and well-nourished. No distress.  HENT:  Head: Normocephalic and atraumatic.  Mouth/Throat: Oropharynx is clear and moist. No oropharyngeal exudate.  Eyes: Conjunctivae are normal. Right eye exhibits no discharge. Left eye exhibits no discharge. No scleral icterus.  Neck: Normal range of motion. Neck supple. No JVD present. No tracheal deviation present. No thyromegaly present.  Cardiovascular: Normal rate, regular rhythm, normal heart sounds and intact distal pulses.  Exam reveals no gallop and no friction rub.   No murmur heard. Pulmonary/Chest: Effort normal and breath sounds normal. No stridor. No respiratory distress. He has no  wheezes. He has no rales. He exhibits no tenderness.  Abdominal: Soft. Bowel sounds are normal. He exhibits no distension and no mass. There is no tenderness. There is no rebound and no guarding.  Musculoskeletal: Normal range of motion. He exhibits no edema or tenderness.  Lymphadenopathy:    He has no cervical adenopathy.  Neurological: He is oriented to person, place, and time.  Skin: Skin is  warm and dry. No rash noted. He is not diaphoretic. No erythema. No pallor.  Vitals reviewed.   Lab Results  Component Value Date   WBC 6.0 09/24/2014   HGB 16.4 09/24/2014   HCT 47.9 09/24/2014   PLT 189.0 09/24/2014   GLUCOSE 88 09/24/2014   CHOL 162 03/25/2014   TRIG 44.0 03/25/2014   HDL 72.90 03/25/2014   LDLCALC 80 03/25/2014   ALT 33 03/25/2014   AST 27 03/25/2014   NA 137 09/24/2014   K 4.1 09/24/2014   CL 101 09/24/2014   CREATININE 0.91 09/24/2014   BUN 14 09/24/2014   CO2 33* 09/24/2014   TSH 1.00 03/25/2014   PSA 1.36 03/25/2014   HGBA1C 5.6 03/25/2014    Mr Lumbar Spine Wo Contrast  03/03/2013   CLINICAL DATA:  Right side low back pain radiating into the right leg and foot. Symptoms for 1 month.  EXAM: MRI LUMBAR SPINE WITHOUT CONTRAST  TECHNIQUE: Multiplanar, multisequence MR imaging was performed. No intravenous contrast was administered.  COMPARISON:  Plain films lumbar spine 02/07/2013. CT abdomen and pelvis 08/09/2012.  FINDINGS: There is some exaggeration of the normal lumbar lordosis. Vertebral body height is maintained. There is no worrisome marrow lesion with a small hemangioma noted in L2. The conus medullaris is normal in signal and position. Imaged intra-abdominal contents are unremarkable.  L1-2: Negative.  L2-3: Negative.  L3-4: Facet degenerative change is identified. No disc bulge or protrusion. The central canal and foramina are open.  L4-5: Facet arthropathy and a shallow disc bulge cause very mild central canal narrowing without nerve root compression. Neural foramina are mildly narrowed.  L5-S1:  Negative.  IMPRESSION: Shallow disc bulge at L4-5 causes very mild central canal narrowing. There is also mild foraminal narrowing at this level. The examination is otherwise negative.   Electronically Signed   By: Inge Rise M.D.   On: 03/03/2013 08:46    Assessment & Plan:   [name] was seen today for hypertension.  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 Villwock maintain his aspirin, tiotropium, traZODone, zolpidem, potassium chloride SA, budesonide-formoterol, chlorthalidone, albuterol, and atorvastatin.  No orders of the defined types were placed in this encounter.     Follow-up: Return in about 4 months (around 01/24/2015).  Scarlette Calico, MD

## 2014-09-24 NOTE — Patient Instructions (Signed)

## 2014-09-24 NOTE — Progress Notes (Signed)
Pre visit review using our clinic review tool, if applicable. No additional management support is needed unless otherwise documented below in the visit note. 

## 2014-10-06 ENCOUNTER — Telehealth: Payer: Self-pay | Admitting: Internal Medicine

## 2014-10-06 ENCOUNTER — Other Ambulatory Visit: Payer: Self-pay | Admitting: Internal Medicine

## 2014-10-06 NOTE — Telephone Encounter (Signed)
MD has already sent refill to [facility]...[name]

## 2014-10-06 NOTE — Telephone Encounter (Signed)
Patient is needing a refill of chlorthalidone (HYGROTON) 25 MG tablet [[id number]] to [facility]

## 2014-10-27 ENCOUNTER — Other Ambulatory Visit: Payer: Self-pay

## 2014-10-27 ENCOUNTER — Telehealth: Payer: Self-pay

## 2014-10-27 DIAGNOSIS — G4700 Insomnia, unspecified: Secondary | ICD-10-CM

## 2014-10-27 MED ORDER — TRAZODONE HCL 50 MG PO TABS
50.0000 mg | ORAL_TABLET | Freq: Every evening | ORAL | Status: DC | PRN
Start: 2014-10-27 — End: 2015-08-04

## 2014-10-27 MED ORDER — ZOLPIDEM TARTRATE 5 MG PO TABS
5.0000 mg | ORAL_TABLET | Freq: Every evening | ORAL | Status: DC | PRN
Start: 2014-10-27 — End: 2015-08-04

## 2014-10-27 NOTE — Telephone Encounter (Signed)
I got a (faxed)  refill request for both Trazodone 50 MG and Ambien 5 MG from [facility] for this Pt. Please Advice.

## 2014-10-27 NOTE — Addendum Note (Signed)
Addended by: Janith Lima on: 10/27/2014 09:47 AM   Modules accepted: Orders

## 2014-10-27 NOTE — Telephone Encounter (Signed)
Rx sent 

## 2014-10-27 NOTE — Telephone Encounter (Signed)
Please fax the Rx to his pharmacy

## 2014-10-28 ENCOUNTER — Other Ambulatory Visit: Payer: Self-pay

## 2014-10-28 MED ORDER — POTASSIUM CHLORIDE CRYS ER 20 MEQ PO TBCR: EXTENDED_RELEASE_TABLET | ORAL | Status: DC

## 2014-11-06 ENCOUNTER — Telehealth: Payer: Self-pay | Admitting: Internal Medicine

## 2014-11-06 NOTE — Telephone Encounter (Signed)
Patient got his refill for symbicort yesterday and he left it in the car which exceeded 77 degrees. He feels it is ruined. He wants to know if you would have any samples. Pharmacy will not replace it for him. To buy OOP would be over $400 Please advise patient [phone]

## 2014-11-06 NOTE — Telephone Encounter (Signed)
Samples provided for patient.(2 bx) for pt to pick up. Patient notified

## 2014-11-18 ENCOUNTER — Other Ambulatory Visit: Payer: Self-pay

## 2014-11-18 MED ORDER — TIOTROPIUM BROMIDE MONOHYDRATE 18 MCG IN CAPS: 18.0000 ug | ORAL_CAPSULE | Freq: Every day | RESPIRATORY_TRACT | Status: DC

## 2014-12-29 ENCOUNTER — Encounter: Payer: Self-pay | Admitting: Internal Medicine

## 2014-12-29 ENCOUNTER — Ambulatory Visit (INDEPENDENT_AMBULATORY_CARE_PROVIDER_SITE_OTHER)
Admission: RE | Admit: 2014-12-29 | Discharge: 2014-12-29 | Disposition: A | Discharge status: Home / Self Care | Payer: PPO | Source: Ambulatory Visit | Attending: Internal Medicine | Admitting: Internal Medicine

## 2014-12-29 ENCOUNTER — Ambulatory Visit (INDEPENDENT_AMBULATORY_CARE_PROVIDER_SITE_OTHER): Payer: PPO | Admitting: Internal Medicine

## 2014-12-29 VITALS — BP 160/78 | HR 87 | Temp 97.9°F | Resp 16 | Ht 74.0 in | Wt 200.0 lb

## 2014-12-29 DIAGNOSIS — S21102A Unspecified open wound of left front wall of thorax without penetration into thoracic cavity, initial encounter: Secondary | ICD-10-CM

## 2014-12-29 DIAGNOSIS — I10 Essential (primary) hypertension: Secondary | ICD-10-CM | POA: Diagnosis not present

## 2014-12-29 DIAGNOSIS — S2232XA Fracture of one rib, left side, initial encounter for closed fracture: Secondary | ICD-10-CM

## 2014-12-29 DIAGNOSIS — S299XXA Unspecified injury of thorax, initial encounter: Secondary | ICD-10-CM | POA: Insufficient documentation

## 2014-12-29 MED ORDER — PROMETHAZINE HCL 12.5 MG PO TABS: 12.5000 mg | ORAL_TABLET | Freq: Four times a day (QID) | ORAL | Status: DC | PRN

## 2014-12-29 MED ORDER — OXYCODONE-ACETAMINOPHEN 7.5-325 MG PO TABS: 1.0000 | ORAL_TABLET | ORAL | Status: DC | PRN

## 2014-12-29 NOTE — Patient Instructions (Signed)

## 2014-12-29 NOTE — Progress Notes (Signed)
Pre visit review using our clinic review tool, if applicable. No additional management support is needed unless otherwise documented below in the visit note. 

## 2014-12-30 ENCOUNTER — Encounter: Payer: Self-pay | Admitting: Internal Medicine

## 2014-12-30 NOTE — Assessment & Plan Note (Signed)
His blood pressure is not adequately well controlled but he is also in pain. I will let him recover from the rib fracture and then recheck his blood pressure.

## 2014-12-30 NOTE — Progress Notes (Signed)
Subjective:  Patient ID: [name], male    DOB: 1941/04/07  Age: 74 y.o. MRN: [id number]  CC: Chest Injury   HPI [name] presents for evaluation of left lateral chest wall injury. He got up during the night and tripped over something and fell and says his left elbow jammed into his left rib cage. He now has pain over the area.  Outpatient Prescriptions Prior to Visit  Medication Sig Dispense Refill  . albuterol (PROVENTIL HFA;VENTOLIN HFA) 108 (90 BASE) MCG/ACT inhaler Inhale 2 puffs into the lungs every 6 (six) hours as needed. 1 Inhaler 11  . aspirin 81 MG tablet Take 81 mg by mouth daily.      Marland Kitchen atorvastatin (LIPITOR) 80 MG tablet TAKE 1 TABLET BY MOUTH ONCE DAILY 90 tablet 2  . budesonide-formoterol (SYMBICORT) 160-4.5 MCG/ACT inhaler Inhale 2 puffs into the lungs 2 (two) times daily. 10.2 g 11  . chlorthalidone (HYGROTON) 25 MG tablet TAKE 1 TABLET BY MOUTH DAILY 90 tablet 1  . potassium chloride SA (K-DUR,KLOR-CON) 20 MEQ tablet TAKE 1 TABLET BY MOUTH 3 TIMES DAILY 90 tablet 6  . tiotropium (SPIRIVA HANDIHALER) 18 MCG inhalation capsule Place 1 capsule (18 mcg total) into inhaler and inhale daily. 30 capsule 11  . traZODone (DESYREL) 50 MG tablet Take 1 tablet (50 mg total) by mouth at bedtime as needed for sleep. 90 tablet 3  . zolpidem (AMBIEN) 5 MG tablet Take 1 tablet (5 mg total) by mouth at bedtime as needed. for sleep 30 tablet 3   No facility-administered medications prior to visit.    ROS Review of Systems  Constitutional: Negative.   HENT: Negative.   Eyes: Negative.   Respiratory: Negative.  Negative for cough, choking, chest tightness, shortness of breath, wheezing and stridor.   Cardiovascular: Positive for chest pain. Negative for palpitations and leg swelling.  Gastrointestinal: Negative.  Negative for nausea, vomiting, abdominal pain, diarrhea, constipation and blood in stool.  Endocrine: Negative.   Genitourinary: Negative.  Negative for dysuria,  urgency, frequency and hematuria.  Musculoskeletal: Negative.   Skin: Negative.  Negative for rash.  Allergic/Immunologic: Negative.   Neurological: Negative.  Negative for dizziness, tremors, light-headedness, numbness and headaches.  Hematological: Negative.  Negative for adenopathy. Does not bruise/bleed easily.  Psychiatric/Behavioral: Negative.     Objective:  BP 160/78 mmHg  Pulse 87  Temp(Src) 97.9 F (36.6 C) (Oral)  Ht 6\' 2"  (1.88 m)  Wt 200 lb (90.719 kg)  BMI 25.67 kg/m2  SpO2 96%  BP Readings from Last 3 Encounters:  12/29/14 160/78  09/24/14 132/80  03/25/14 122/60    Wt Readings from Last 3 Encounters:  12/29/14 200 lb (90.719 kg)  09/24/14 208 lb 12 oz (94.688 kg)  03/25/14 216 lb (97.977 kg)    Physical Exam  Constitutional: He is oriented to person, place, and time. He appears well-developed and well-nourished.  Non-toxic appearance. He does not have a sickly appearance. He does not appear ill. No distress.  HENT:  Head: Normocephalic and atraumatic.  Mouth/Throat: Oropharynx is clear and moist. No oropharyngeal exudate.  Eyes: Conjunctivae are normal. Right eye exhibits no discharge. Left eye exhibits no discharge. No scleral icterus.  Neck: Normal range of motion. Neck supple. No JVD present. No tracheal deviation present. No thyromegaly present.  Cardiovascular: Normal rate, regular rhythm, normal heart sounds and intact distal pulses.  Exam reveals no gallop and no friction rub.   No murmur heard. Pulmonary/Chest: Effort normal and breath  sounds normal. No accessory muscle usage or stridor. No respiratory distress. He has no decreased breath sounds. He has no wheezes. He has no rhonchi. He has no rales. Chest wall is not dull to percussion. He exhibits tenderness and bony tenderness. He exhibits no mass, no laceration, no crepitus, no edema, no deformity, no swelling and no retraction.    Abdominal: Soft. Bowel sounds are normal. He exhibits no  distension and no mass. There is no tenderness. There is no rebound and no guarding.  Musculoskeletal: Normal range of motion. He exhibits no edema.       Left elbow: He exhibits laceration. He exhibits normal range of motion, no swelling, no effusion and no deformity. No tenderness found.  There are superficial abrasions around the left elbow but it is not tender and there is normal range of motion.  Lymphadenopathy:    He has no cervical adenopathy.  Neurological: He is oriented to person, place, and time.  Skin: Skin is warm and dry. No rash noted. He is not diaphoretic. No erythema. No pallor.    Lab Results  Component Value Date   WBC 6.0 09/24/2014   HGB 16.4 09/24/2014   HCT 47.9 09/24/2014   PLT 189.0 09/24/2014   GLUCOSE 88 09/24/2014   CHOL 162 03/25/2014   TRIG 44.0 03/25/2014   HDL 72.90 03/25/2014   LDLCALC 80 03/25/2014   ALT 33 03/25/2014   AST 27 03/25/2014   NA 137 09/24/2014   K 4.1 09/24/2014   CL 101 09/24/2014   CREATININE 0.91 09/24/2014   BUN 14 09/24/2014   CO2 33* 09/24/2014   TSH 1.00 03/25/2014   PSA 1.36 03/25/2014   HGBA1C 5.6 03/25/2014    Dg Ribs Unilateral W/chest Left  12/29/2014   CLINICAL DATA:  Fall, left lower anterior chest wall pain  EXAM: LEFT RIBS AND CHEST - 3+ VIEW  COMPARISON:  09/24/2014  FINDINGS: Calcified hilar lymph nodes and bilateral pulmonary granulomas reidentified. Heart size normal. No pleural effusion. Linear lucency suggests nondisplaced fracture of the left lateral seventh rib. On the second view, there is also likely nondisplaced fracture of the left lateral eighth rib. No pneumothorax.  IMPRESSION: Nondisplaced fractures, left lateral seventh and eighth ribs.   Electronically Signed   By: Conchita Paris M.D.   On: 12/29/2014 11:24    Assessment & Plan:   [name] was seen today for chest injury.  Diagnoses and all orders for this visit:  Chest wall soft tissue injury, left, initial encounter- he will will apply ice  and take Percocet for pain relief. -     DG Ribs Unilateral W/Chest Left; Future -     oxyCODONE-acetaminophen (PERCOCET) 7.5-325 MG per tablet; Take 1 tablet by mouth every 4 (four) hours as needed.  Left rib fracture, closed, initial encounter- there is a nondisplaced rib fracture. He will take Percocet for pain and use Phenergan for any nausea or vomiting. -     DG Ribs Unilateral W/Chest Left; Future -     promethazine (PHENERGAN) 12.5 MG tablet; Take 1 tablet (12.5 mg total) by mouth every 6 (six) hours as needed for nausea or vomiting. -     oxyCODONE-acetaminophen (PERCOCET) 7.5-325 MG per tablet; Take 1 tablet by mouth every 4 (four) hours as needed.   I am having Mr. Andreoni start on promethazine and oxyCODONE-acetaminophen. I am also having him maintain his aspirin, budesonide-formoterol, albuterol, atorvastatin, chlorthalidone, traZODone, zolpidem, potassium chloride SA, and tiotropium.  Meds ordered this encounter  Medications  . promethazine (PHENERGAN) 12.5 MG tablet    Sig: Take 1 tablet (12.5 mg total) by mouth every 6 (six) hours as needed for nausea or vomiting.    Dispense:  35 tablet    Refill:  0  . oxyCODONE-acetaminophen (PERCOCET) 7.5-325 MG per tablet    Sig: Take 1 tablet by mouth every 4 (four) hours as needed.    Dispense:  50 tablet    Refill:  0     Follow-up: Return in about 3 weeks (around 01/19/2015).  Scarlette Calico, MD

## 2015-01-14 ENCOUNTER — Telehealth: Payer: Self-pay | Admitting: *Deleted

## 2015-01-14 DIAGNOSIS — S2232XA Fracture of one rib, left side, initial encounter for closed fracture: Secondary | ICD-10-CM

## 2015-01-14 DIAGNOSIS — S21102A Unspecified open wound of left front wall of thorax without penetration into thoracic cavity, initial encounter: Secondary | ICD-10-CM

## 2015-01-14 MED ORDER — OXYCODONE-ACETAMINOPHEN 7.5-325 MG PO TABS: 1.0000 | ORAL_TABLET | Freq: Two times a day (BID) | ORAL | Status: DC | PRN

## 2015-01-14 NOTE — Telephone Encounter (Signed)
Left msg on triage stating saw md couple weeks ago he has broken abt 16 ribs. MD gave rx for oxycodone he is needing more he stated he only has 1 pill left. MD is out of the office pls advise...[name]

## 2015-01-14 NOTE — Telephone Encounter (Signed)
Called pt spoke with wife left msg rx ready fo pick-up...[name]

## 2015-01-14 NOTE — Telephone Encounter (Signed)
Will refill for #20, printed and signed. And he broke 2 ribs only, not 16.

## 2015-01-25 ENCOUNTER — Telehealth: Payer: Self-pay | Admitting: Internal Medicine

## 2015-01-25 ENCOUNTER — Ambulatory Visit: Payer: PPO | Admitting: Internal Medicine

## 2015-01-25 NOTE — Telephone Encounter (Signed)
[facility]    --------------------------------------------------------------------------------   Patient Name: [name]  Gender: Male  DOB: Jan 02, 1941   Age: 74 Y 75 M 23 D  Return Phone Number: [phone] (Primary), [phone] (Secondary)  Address:     City/State/Zip:  [place]     Client [facility] - Client  Client Site [facility] - Day  Physician Jones, Branch Type Call  Call Type Triage / Clinical  Caller Name Mariann Laster  Relationship To Patient Mother  Return Phone Number [phone] (Primary)  Chief Complaint Feet swelling  Initial Comment Caller states they are having swelling in both of his legs and feet. He has a rash near his ankles as well.  PreDisposition Did not know what to do       Nurse Assessment  Nurse: Amalia Hailey, RN, Melissa Date/Time (Eastern Time): 01/25/2015 4:40:47 PM  Confirm and document reason for call. If symptomatic, describe symptoms. ---Caller states they are having swelling in both of his legs and feet. He has a rash near his ankles as well.    Has the patient traveled out of the country within the last 30 days? ---Not Applicable    Does the patient have any new or worsening symptoms? ---Yes    Will a triage be completed? ---Yes    Related visit to physician within the last 2 weeks? ---No    Does the PT have any chronic conditions? (i.e. diabetes, asthma, etc.) ---Yes    List chronic conditions. ---COPD, hypertension, Fxd ribs 4 weeks ago.           Guidelines          Guideline Title Affirmed Question Affirmed Notes Nurse Date/Time Eilene Ghazi Time)  Leg Swelling and Edema [1] Red area or streak [2] large (> 2 in. or 5 cm)    Amalia Hailey, RN, Melissa 01/25/2015 4:43:08 PM    Disp. Time Eilene Ghazi Time) Disposition Final User         01/25/2015 4:47:05 PM See Physician within 4 Hours (or PCP triage) Yes Amalia Hailey, RN, Lenna Sciara             Caller Understands: Yes  Disagree/Comply: Comply       Care Advice Given Per Guideline        SEE PHYSICIAN WITHIN 4 HOURS (or PCP triage): CALL BACK IF: * You become worse.    After Care Instructions Given        Call Event Type User Date / Time Description        --------------------------------------------------------------------------------            Referrals  REFERRED TO PCP OFFICE  [facility] - ED

## 2015-01-26 NOTE — Telephone Encounter (Signed)
No ED note. Can we get this pt on schedule for today please.

## 2015-01-26 NOTE — Telephone Encounter (Signed)
Spoke to pt he states he went to [facility] urgent [facility] yesterday. Was put on a diuretic and anitbioitcs. On schedule with Dr. Ronnald Ramp 02/02/15 for f/u. Dr. Ronnald Ramp out of office this week. Will call office with any further problems

## 2015-02-02 ENCOUNTER — Encounter: Payer: Self-pay | Admitting: Internal Medicine

## 2015-02-02 ENCOUNTER — Other Ambulatory Visit (INDEPENDENT_AMBULATORY_CARE_PROVIDER_SITE_OTHER): Payer: PPO

## 2015-02-02 ENCOUNTER — Ambulatory Visit (INDEPENDENT_AMBULATORY_CARE_PROVIDER_SITE_OTHER): Payer: PPO | Admitting: Internal Medicine

## 2015-02-02 ENCOUNTER — Other Ambulatory Visit: Payer: Self-pay | Admitting: Internal Medicine

## 2015-02-02 VITALS — BP 168/78 | HR 79 | Temp 97.7°F | Resp 16 | Ht 74.0 in | Wt 203.0 lb

## 2015-02-02 DIAGNOSIS — R609 Edema, unspecified: Secondary | ICD-10-CM | POA: Diagnosis not present

## 2015-02-02 DIAGNOSIS — I10 Essential (primary) hypertension: Secondary | ICD-10-CM

## 2015-02-02 DIAGNOSIS — R739 Hyperglycemia, unspecified: Secondary | ICD-10-CM

## 2015-02-02 DIAGNOSIS — I872 Venous insufficiency (chronic) (peripheral): Secondary | ICD-10-CM | POA: Diagnosis not present

## 2015-02-02 DIAGNOSIS — Z23 Encounter for immunization: Secondary | ICD-10-CM

## 2015-02-02 LAB — COMPREHENSIVE METABOLIC PANEL
ALBUMIN: 4.3 g/dL (ref 3.5–5.2)
ALT: 18 U/L (ref 0–53)
AST: 23 U/L (ref 0–37)
Alkaline Phosphatase: 52 U/L (ref 39–117)
BUN: 12 mg/dL (ref 6–23)
CALCIUM: 10 mg/dL (ref 8.4–10.5)
CHLORIDE: 99 meq/L (ref 96–112)
CO2: 32 mEq/L (ref 19–32)
CREATININE: 0.83 mg/dL (ref 0.40–1.50)
GFR: 95.85 mL/min (ref >60.00)
Glucose, Bld: 95 mg/dL (ref 70–99)
Potassium: 3.6 mEq/L (ref 3.5–5.1)
Sodium: 141 mEq/L (ref 135–145)
Total Bilirubin: 1.3 mg/dL — ABNORMAL HIGH (ref 0.2–1.2)
Total Protein: 6.9 g/dL (ref 6.0–8.3)

## 2015-02-02 LAB — CBC WITH DIFFERENTIAL/PLATELET
BASOS PCT: 0.4 % (ref 0.0–3.0)
Basophils Absolute: 0 10*3/uL (ref 0.0–0.1)
EOS ABS: 0.2 10*3/uL (ref 0.0–0.7)
EOS PCT: 2.9 % (ref 0.0–5.0)
HCT: 46.5 % (ref 39.0–52.0)
HEMOGLOBIN: 16 g/dL (ref 13.0–17.0)
LYMPHS ABS: 1.4 10*3/uL (ref 0.7–4.0)
Lymphocytes Relative: 20.9 % (ref 12.0–46.0)
MCHC: 34.4 g/dL (ref 30.0–36.0)
MCV: 95.7 fl (ref 78.0–100.0)
MONO ABS: 0.7 10*3/uL (ref 0.1–1.0)
Monocytes Relative: 10.7 % (ref 3.0–12.0)
NEUTROS ABS: 4.3 10*3/uL (ref 1.4–7.7)
Neutrophils Relative %: 65.1 % (ref 43.0–77.0)
PLATELETS: 195 10*3/uL (ref 150.0–400.0)
RBC: 4.85 Mil/uL (ref 4.22–5.81)
RDW: 13.8 % (ref 11.5–15.5)
WBC: 6.6 10*3/uL (ref 4.0–10.5)

## 2015-02-02 LAB — URINALYSIS, ROUTINE W REFLEX MICROSCOPIC
BILIRUBIN URINE: NEGATIVE
Hgb urine dipstick: NEGATIVE
KETONES UR: NEGATIVE
Leukocytes, UA: NEGATIVE
NITRITE: NEGATIVE
PH: 7 (ref 5.0–8.0)
Specific Gravity, Urine: 1.01 (ref 1.000–1.030)
TOTAL PROTEIN, URINE-UPE24: NEGATIVE
Urine Glucose: NEGATIVE
Urobilinogen, UA: 0.2 (ref 0.0–1.0)

## 2015-02-02 LAB — TSH: TSH: 1.03 u[IU]/mL (ref 0.35–4.50)

## 2015-02-02 LAB — BRAIN NATRIURETIC PEPTIDE: Pro B Natriuretic peptide (BNP): 14 pg/mL (ref 0.0–100.0)

## 2015-02-02 MED ORDER — FUROSEMIDE 40 MG PO TABS: 40.0000 mg | ORAL_TABLET | Freq: Two times a day (BID) | ORAL | Status: DC

## 2015-02-02 MED ORDER — VASCULERA PO TABS: 1.0000 | ORAL_TABLET | Freq: Every day | ORAL | Status: DC

## 2015-02-02 NOTE — Patient Instructions (Signed)

## 2015-02-02 NOTE — Progress Notes (Signed)
Subjective:  Patient ID: [name], male    DOB: August 18, 1940  Age: 74 y.o. MRN: [id number]  CC: Leg Swelling and Hypertension   HPI [name] presents for one-week history of edema in both lower legs. He also complains of dark itchy spots on both legs more on the right than the left. He tells me that he was recently seen at urgent care center and says ultrasound of his legs were none and labs were drawn. He doesn't know the results of any of those tests. He has not heard back from the urgent care center. He denies taking any over-the-counter medicines or could've contributed to this, he denies an excessive salt intake, and he denies any episodes of immobility.  Outpatient Prescriptions Prior to Visit  Medication Sig Dispense Refill  . albuterol (PROVENTIL HFA;VENTOLIN HFA) 108 (90 BASE) MCG/ACT inhaler Inhale 2 puffs into the lungs every 6 (six) hours as needed. 1 Inhaler 11  . aspirin 81 MG tablet Take 81 mg by mouth daily.      Marland Kitchen atorvastatin (LIPITOR) 80 MG tablet TAKE 1 TABLET BY MOUTH ONCE DAILY 90 tablet 2  . potassium chloride SA (K-DUR,KLOR-CON) 20 MEQ tablet TAKE 1 TABLET BY MOUTH 3 TIMES DAILY 90 tablet 6  . tiotropium (SPIRIVA HANDIHALER) 18 MCG inhalation capsule Place 1 capsule (18 mcg total) into inhaler and inhale daily. 30 capsule 11  . traZODone (DESYREL) 50 MG tablet Take 1 tablet (50 mg total) by mouth at bedtime as needed for sleep. 90 tablet 3  . zolpidem (AMBIEN) 5 MG tablet Take 1 tablet (5 mg total) by mouth at bedtime as needed. for sleep 30 tablet 3  . budesonide-formoterol (SYMBICORT) 160-4.5 MCG/ACT inhaler Inhale 2 puffs into the lungs 2 (two) times daily. 10.2 g 11  . chlorthalidone (HYGROTON) 25 MG tablet TAKE 1 TABLET BY MOUTH DAILY 90 tablet 1  . oxyCODONE-acetaminophen (PERCOCET) 7.5-325 MG per tablet Take 1 tablet by mouth 2 (two) times daily as needed. 20 tablet 0  . promethazine (PHENERGAN) 12.5 MG tablet Take 1 tablet (12.5 mg total) by mouth every 6  (six) hours as needed for nausea or vomiting. 35 tablet 0   No facility-administered medications prior to visit.    ROS Review of Systems  Constitutional: Negative.  Negative for fever, chills, diaphoresis, appetite change and fatigue.  HENT: Negative.  Negative for trouble swallowing.   Eyes: Negative.   Respiratory: Negative.  Negative for cough, choking, chest tightness and shortness of breath.   Cardiovascular: Positive for leg swelling. Negative for chest pain and palpitations.  Gastrointestinal: Negative.  Negative for nausea, vomiting, abdominal pain, diarrhea, constipation and blood in stool.  Endocrine: Negative.   Genitourinary: Negative.   Musculoskeletal: Negative.  Negative for back pain, arthralgias and neck pain.  Skin: Positive for rash. Negative for color change, pallor and wound.  Allergic/Immunologic: Negative.   Neurological: Negative.  Negative for dizziness, syncope, speech difficulty, weakness, light-headedness and headaches.  Hematological: Negative.   Psychiatric/Behavioral: Negative.     Objective:  BP 168/78 mmHg  Pulse 79  Temp(Src) 97.7 F (36.5 C) (Oral)  Resp 16  Ht 6\' 2"  (1.88 m)  Wt 203 lb (92.08 kg)  BMI 26.05 kg/m2  SpO2 96%  BP Readings from Last 3 Encounters:  02/02/15 168/78  12/29/14 160/78  09/24/14 132/80    Wt Readings from Last 3 Encounters:  02/02/15 203 lb (92.08 kg)  12/29/14 200 lb (90.719 kg)  09/24/14 208 lb 12 oz (  94.688 kg)    Physical Exam  Constitutional: He is oriented to person, place, and time. No distress.  HENT:  Mouth/Throat: Oropharynx is clear and moist. No oropharyngeal exudate.  Eyes: Conjunctivae are normal. Right eye exhibits no discharge. Left eye exhibits no discharge. No scleral icterus.  Neck: Normal range of motion. Neck supple. No JVD present. No tracheal deviation present. No thyromegaly present.  Cardiovascular: Normal rate, regular rhythm, normal heart sounds and intact distal pulses.  Exam  reveals no gallop and no friction rub.   No murmur heard. Pulses:      Carotid pulses are 1+ on the right side, and 1+ on the left side.      Radial pulses are 1+ on the right side, and 1+ on the left side.       Femoral pulses are 1+ on the right side, and 1+ on the left side.      Popliteal pulses are 1+ on the right side, and 1+ on the left side.       Dorsalis pedis pulses are 1+ on the right side, and 1+ on the left side.       Posterior tibial pulses are 1+ on the right side, and 1+ on the left side.  EKG - NSR, No LVH, No ST/T wave changes  Pulmonary/Chest: Effort normal and breath sounds normal. No stridor. No respiratory distress. He has no wheezes. He has no rales. He exhibits no tenderness.  Abdominal: Soft. Bowel sounds are normal. He exhibits no distension and no mass. There is no tenderness. There is no rebound and no guarding.  Musculoskeletal: Normal range of motion. He exhibits edema (1+ pitting edema in BLE). He exhibits no tenderness.  Lymphadenopathy:    He has no cervical adenopathy.  Neurological: He is oriented to person, place, and time.  Skin: Skin is warm and dry. Rash noted. No purpura noted. Rash is macular. Rash is not papular, not maculopapular, not nodular, not vesicular and not urticarial. He is not diaphoretic. No cyanosis. No pallor. Nails show no clubbing.     Psychiatric: He has a normal mood and affect. His behavior is normal. Judgment and thought content normal.  Vitals reviewed.   Lab Results  Component Value Date   WBC 6.0 09/24/2014   HGB 16.4 09/24/2014   HCT 47.9 09/24/2014   PLT 189.0 09/24/2014   GLUCOSE 88 09/24/2014   CHOL 162 03/25/2014   TRIG 44.0 03/25/2014   HDL 72.90 03/25/2014   LDLCALC 80 03/25/2014   ALT 33 03/25/2014   AST 27 03/25/2014   NA 137 09/24/2014   K 4.1 09/24/2014   CL 101 09/24/2014   CREATININE 0.91 09/24/2014   BUN 14 09/24/2014   CO2 33* 09/24/2014   TSH 1.00 03/25/2014   PSA 1.36 03/25/2014   HGBA1C  5.6 03/25/2014    Dg Ribs Unilateral W/chest Left  12/29/2014   CLINICAL DATA:  Fall, left lower anterior chest wall pain  EXAM: LEFT RIBS AND CHEST - 3+ VIEW  COMPARISON:  09/24/2014  FINDINGS: Calcified hilar lymph nodes and bilateral pulmonary granulomas reidentified. Heart size normal. No pleural effusion. Linear lucency suggests nondisplaced fracture of the left lateral seventh rib. On the second view, there is also likely nondisplaced fracture of the left lateral eighth rib. No pneumothorax.  IMPRESSION: Nondisplaced fractures, left lateral seventh and eighth ribs.   Electronically Signed   By: Conchita Paris M.D.   On: 12/29/2014 11:24    Assessment & Plan:  Tasman was seen today for leg swelling and hypertension.  Diagnoses and all orders for this visit:  Essential hypertension- his blood pressure is not adequately well controlled, when he was recently seen at the urgent care center they added Lasix to the chlorthalidone that he was already taking. I think now that he has peripheral edema I will discontinue the chlorthalidone and will increase the dose of the Lasix from 20 mg once a day to 40 mg twice a day. -     Comprehensive metabolic panel; Future -     TSH; Future -     Urinalysis, Routine w reflex microscopic (not at [facility]); Future -     furosemide (LASIX) 40 MG tablet; Take 1 tablet (40 mg total) by mouth 2 (two) times daily.  Hyperglycemia  Chronic venous insufficiency-I think he would benefit from taking Vasculera, this will help him to prevent developing any venous stasis ulcers. -     Dietary Management Product (VASCULERA) TABS; Take 1 capsule by mouth daily.  Edema, unspecified type- I will then a peripheral edema he does not have any cardiopulmonary symptoms. His EKG is normal, I have ordered an echocardiogram to be certain that he does not have diastolic dysfunction. In the meantime will evaluate him for other causes of edema such as nephrotic syndrome, renal  insufficiency, low protein states, DVT, congestive heart failure with fluid retention, hypothyroidism, and electrolyte abnormality. -     CBC with Differential/Platelet; Future -     Brain natriuretic peptide; Future -     Comprehensive metabolic panel; Future -     TSH; Future -     Urinalysis, Routine w reflex microscopic (not at [facility]); Future -     ECHOCARDIOGRAM COMPLETE; Future -     D-dimer, quantitative (not at [facility]); Future -     EKG 12-Lead -     furosemide (LASIX) 40 MG tablet; Take 1 tablet (40 mg total) by mouth 2 (two) times daily.  Need for influenza vaccination -     Flu vaccine HIGH DOSE PF  I have discontinued Mr. Wegner chlorthalidone, promethazine, oxyCODONE-acetaminophen, cephALEXin, and furosemide. I am also having him start on VASCULERA and furosemide. Additionally, I am having him maintain his aspirin, albuterol, atorvastatin, traZODone, zolpidem, potassium chloride SA, and tiotropium.  Meds ordered this encounter  Medications  . DISCONTD: cephALEXin (KEFLEX) 500 MG capsule    Sig: Take 500 mg by mouth 4 (four) times daily.  Marland Kitchen DISCONTD: furosemide (LASIX) 20 MG tablet    Sig: Take 20 mg by mouth daily.  . Dietary Management Product (VASCULERA) TABS    Sig: Take 1 capsule by mouth daily.    Dispense:  30 tablet    Refill:  11  . furosemide (LASIX) 40 MG tablet    Sig: Take 1 tablet (40 mg total) by mouth 2 (two) times daily.    Dispense:  60 tablet    Refill:  5     Follow-up: Return in about 3 weeks (around 02/23/2015).  Scarlette Calico, MD

## 2015-02-02 NOTE — Progress Notes (Signed)
Pre visit review using our clinic review tool, if applicable. No additional management support is needed unless otherwise documented below in the visit note. 

## 2015-02-03 ENCOUNTER — Encounter: Payer: Self-pay | Admitting: Internal Medicine

## 2015-02-03 LAB — D-DIMER, QUANTITATIVE: D-Dimer, Quant: 1.56 ug/mL-FEU — ABNORMAL HIGH (ref 0.00–0.48)

## 2015-02-10 ENCOUNTER — Ambulatory Visit (HOSPITAL_COMMUNITY): Payer: PPO | Attending: Cardiology

## 2015-02-10 ENCOUNTER — Other Ambulatory Visit: Payer: Self-pay | Admitting: Internal Medicine

## 2015-02-10 ENCOUNTER — Other Ambulatory Visit: Payer: Self-pay

## 2015-02-10 DIAGNOSIS — I340 Nonrheumatic mitral (valve) insufficiency: Secondary | ICD-10-CM | POA: Diagnosis not present

## 2015-02-10 DIAGNOSIS — I77810 Thoracic aortic ectasia: Secondary | ICD-10-CM | POA: Insufficient documentation

## 2015-02-10 DIAGNOSIS — R609 Edema, unspecified: Secondary | ICD-10-CM | POA: Insufficient documentation

## 2015-02-10 DIAGNOSIS — I10 Essential (primary) hypertension: Secondary | ICD-10-CM | POA: Insufficient documentation

## 2015-02-11 ENCOUNTER — Encounter: Payer: Self-pay | Admitting: Internal Medicine

## 2015-04-15 ENCOUNTER — Telehealth: Payer: Self-pay | Admitting: Internal Medicine

## 2015-04-15 NOTE — Telephone Encounter (Signed)
Please advise on samples.  

## 2015-04-15 NOTE — Telephone Encounter (Signed)
Pt called state that Dietary Management Product (VASCULERA) TABS price has increase, pt was wondering if Dr. Jenny Reichmann can recommend something else that is cheaper. Please call him back

## 2015-04-16 NOTE — Telephone Encounter (Signed)
I do have some samples- There is nothing else comparable to this.

## 2015-04-16 NOTE — Telephone Encounter (Signed)
Pt informed

## 2015-05-13 DIAGNOSIS — L309 Dermatitis, unspecified: Secondary | ICD-10-CM | POA: Diagnosis not present

## 2015-05-13 DIAGNOSIS — D485 Neoplasm of uncertain behavior of skin: Secondary | ICD-10-CM | POA: Diagnosis not present

## 2015-05-13 DIAGNOSIS — R202 Paresthesia of skin: Secondary | ICD-10-CM | POA: Diagnosis not present

## 2015-05-13 DIAGNOSIS — D225 Melanocytic nevi of trunk: Secondary | ICD-10-CM | POA: Diagnosis not present

## 2015-05-13 DIAGNOSIS — Z23 Encounter for immunization: Secondary | ICD-10-CM | POA: Diagnosis not present

## 2015-05-13 DIAGNOSIS — L821 Other seborrheic keratosis: Secondary | ICD-10-CM | POA: Diagnosis not present

## 2015-06-24 ENCOUNTER — Ambulatory Visit (INDEPENDENT_AMBULATORY_CARE_PROVIDER_SITE_OTHER): Payer: PPO

## 2015-06-24 VITALS — BP 148/80 | Ht 74.0 in | Wt 206.5 lb

## 2015-06-24 DIAGNOSIS — Z0000 Encounter for general adult medical examination without abnormal findings: Secondary | ICD-10-CM

## 2015-06-24 NOTE — Patient Instructions (Addendum)
[name] , Thank you for taking time to come for your Medicare Wellness Visit. I appreciate your ongoing commitment to your health goals. Please review the following plan we discussed and let me know if I can assist you in the future.   Will have eye exam soon;   Smoking discussed Educated to avoid secondary smoke Smoking cessation at [facility]: [phone] Classes offered a couple of times a month;  Will work with the patient as far as registration and location  [employer] 1-800-QUIT-NOW [phone]).   These are the goals we discussed: Goals    . patient     Bored at times; concerned wife;  Would try to do things together with spouse; day excursions;          This is a list of the screening recommended for you and due dates:  Health Maintenance  Topic Date Due  . Flu Shot  11/23/2015  . Tetanus Vaccine  11/06/2016  . Colon Cancer Screening  03/07/2020  . Shingles Vaccine  Addressed  . Pneumonia vaccines  Completed     Fall Prevention in the Home  Falls can cause injuries. They can happen to people of all ages. There are many things you can do to make your home safe and to help prevent falls.  WHAT CAN I DO ON THE OUTSIDE OF MY HOME?  Regularly fix the edges of walkways and driveways and fix any cracks.  Remove anything that might make you trip as you walk through a door, such as a raised step or threshold.  Trim any bushes or trees on the path to your home.  Use bright outdoor lighting.  Clear any walking paths of anything that might make someone trip, such as rocks or tools.  Regularly check to see if handrails are loose or broken. Make sure that both sides of any steps have handrails.  Any raised decks and porches should have guardrails on the edges.  Have any leaves, snow, or ice cleared regularly.  Use sand or salt on walking paths during winter.  Clean up any spills in your garage right away. This includes oil or grease spills. WHAT CAN I DO IN THE  BATHROOM?   Use night lights.  Install grab bars by the toilet and in the tub and shower. Do not use towel bars as grab bars.  Use non-skid mats or decals in the tub or shower.  If you need to sit down in the shower, use a plastic, non-slip stool.  Keep the floor dry. Clean up any water that spills on the floor as soon as it happens.  Remove soap buildup in the tub or shower regularly.  Attach bath mats securely with double-sided non-slip rug tape.  Do not have throw rugs and other things on the floor that can make you trip. WHAT CAN I DO IN THE BEDROOM?  Use night lights.  Make sure that you have a light by your bed that is easy to reach.  Do not use any sheets or blankets that are too big for your bed. They should not hang down onto the floor.  Have a firm chair that has side arms. You can use this for support while you get dressed.  Do not have throw rugs and other things on the floor that can make you trip. WHAT CAN I DO IN THE KITCHEN?  Clean up any spills right away.  Avoid walking on wet floors.  Keep items that you use a lot  in easy-to-reach places.  If you need to reach something above you, use a strong step stool that has a grab bar.  Keep electrical cords out of the way.  Do not use floor polish or wax that makes floors slippery. If you must use wax, use non-skid floor wax.  Do not have throw rugs and other things on the floor that can make you trip. WHAT CAN I DO WITH MY STAIRS?  Do not leave any items on the stairs.  Make sure that there are handrails on both sides of the stairs and use them. Fix handrails that are broken or loose. Make sure that handrails are as long as the stairways.  Check any carpeting to make sure that it is firmly attached to the stairs. Fix any carpet that is loose or worn.  Avoid having throw rugs at the top or bottom of the stairs. If you do have throw rugs, attach them to the floor with carpet tape.  Make sure that you have  a light switch at the top of the stairs and the bottom of the stairs. If you do not have them, ask someone to add them for you. WHAT ELSE CAN I DO TO HELP PREVENT FALLS?  Wear shoes that:  Do not have high heels.  Have rubber bottoms.  Are comfortable and fit you well.  Are closed at the toe. Do not wear sandals.  If you use a stepladder:  Make sure that it is fully opened. Do not climb a closed stepladder.  Make sure that both sides of the stepladder are locked into place.  Ask someone to hold it for you, if possible.  Clearly mark and make sure that you can see:  Any grab bars or handrails.  First and last steps.  Where the edge of each step is.  Use tools that help you move around (mobility aids) if they are needed. These include:  Canes.  Walkers.  Scooters.  Crutches.  Turn on the lights when you go into a dark area. Replace any light bulbs as soon as they burn out.  Set up your furniture so you have a clear path. Avoid moving your furniture around.  If any of your floors are uneven, fix them.  If there are any pets around you, be aware of where they are.  Review your medicines with your doctor. Some medicines can make you feel dizzy. This can increase your chance of falling. Ask your doctor what other things that you can do to help prevent falls.   This information is not intended to replace advice given to you by your health care provider. Make sure you discuss any questions you have with your health care provider.   Document Released: 02/04/2009 Document Revised: 08/25/2014 Document Reviewed: 05/15/2014 Elsevier Interactive Patient Education 2016 [place] Maintenance, Male A healthy lifestyle and preventative care can promote health and wellness.  Maintain regular health, dental, and eye exams.  Eat a healthy diet. Foods like vegetables, fruits, whole grains, low-fat dairy products, and lean protein foods contain the nutrients you need  and are low in calories. Decrease your intake of foods high in solid fats, added sugars, and salt. Get information about a proper diet from your health care provider, if necessary.  Regular physical exercise is one of the most important things you can do for your health. Most adults should get at least 150 minutes of moderate-intensity exercise (any activity that increases your heart rate and causes you  to sweat) each week. In addition, most adults need muscle-strengthening exercises on 2 or more days a week.   Maintain a healthy weight. The body mass index (BMI) is a screening tool to identify possible weight problems. It provides an estimate of body fat based on height and weight. Your health care provider can find your BMI and can help you achieve or maintain a healthy weight. For males 20 years and older:  A BMI below 18.5 is considered underweight.  A BMI of 18.5 to 24.9 is normal.  A BMI of 25 to 29.9 is considered overweight.  A BMI of 30 and above is considered obese.  Maintain normal blood lipids and cholesterol by exercising and minimizing your intake of saturated fat. Eat a balanced diet with plenty of fruits and vegetables. Blood tests for lipids and cholesterol should begin at age 30 and be repeated every 5 years. If your lipid or cholesterol levels are high, you are over age 70, or you are at high risk for heart disease, you may need your cholesterol levels checked more frequently.Ongoing high lipid and cholesterol levels should be treated with medicines if diet and exercise are not working.  If you smoke, find out from your health care provider how to quit. If you do not use tobacco, do not start.  Lung cancer screening is recommended for adults aged 67-80 years who are at high risk for developing lung cancer because of a history of smoking. A yearly low-dose CT scan of the lungs is recommended for people who have at least a 30-pack-year history of smoking and are current smokers  or have quit within the past 15 years. A pack year of smoking is smoking an average of 1 pack of cigarettes a day for 1 year (for example, a 30-pack-year history of smoking could mean smoking 1 pack a day for 30 years or 2 packs a day for 15 years). Yearly screening should continue until the smoker has stopped smoking for at least 15 years. Yearly screening should be stopped for people who develop a health problem that would prevent them from having lung cancer treatment.  If you choose to drink alcohol, do not have more than 2 drinks per day. One drink is considered to be 12 oz (360 mL) of beer, 5 oz (150 mL) of wine, or 1.5 oz (45 mL) of liquor.  Avoid the use of street drugs. Do not share needles with anyone. Ask for help if you need support or instructions about stopping the use of drugs.  High blood pressure causes heart disease and increases the risk of stroke. High blood pressure is more likely to develop in:  People who have blood pressure in the end of the normal range (100-139/85-89 mm Hg).  People who are overweight or obese.  People who are African American.  If you are 44-46 years of age, have your blood pressure checked every 3-5 years. If you are 49 years of age or older, have your blood pressure checked every year. You should have your blood pressure measured twice--once when you are at a hospital or clinic, and once when you are not at a hospital or clinic. Record the average of the two measurements. To check your blood pressure when you are not at a hospital or clinic, you can use:  An automated blood pressure machine at a pharmacy.  A home blood pressure monitor.  If you are 66-86 years old, ask your health care provider if you should take aspirin  to prevent heart disease.  Diabetes screening involves taking a blood sample to check your fasting blood sugar level. This should be done once every 3 years after age 57 if you are at a normal weight and without risk factors for  diabetes. Testing should be considered at a younger age or be carried out more frequently if you are overweight and have at least 1 risk factor for diabetes.  Colorectal cancer can be detected and often prevented. Most routine colorectal cancer screening begins at the age of 38 and continues through age 25. However, your health care provider may recommend screening at an earlier age if you have risk factors for colon cancer. On a yearly basis, your health care provider may provide home test kits to check for hidden blood in the stool. A small camera at the end of a tube may be used to directly examine the colon (sigmoidoscopy or colonoscopy) to detect the earliest forms of colorectal cancer. Talk to your health care provider about this at age 61 when routine screening begins. A direct exam of the colon should be repeated every 5-10 years through age 52, unless early forms of precancerous polyps or small growths are found.  People who are at an increased risk for hepatitis B should be screened for this virus. You are considered at high risk for hepatitis B if:  You were born in a country where hepatitis B occurs often. Talk with your health care provider about which countries are considered high risk.  Your parents were born in a high-risk country and you have not received a shot to protect against hepatitis B (hepatitis B vaccine).  You have HIV or AIDS.  You use needles to inject street drugs.  You live with, or have sex with, someone who has hepatitis B.  You are a man who has sex with other men (MSM).  You get hemodialysis treatment.  You take certain medicines for conditions like cancer, organ transplantation, and autoimmune conditions.  Hepatitis C blood testing is recommended for all people born from [birth year] through 1966 and any individual with known risk factors for hepatitis C.  Healthy men should no longer receive prostate-specific antigen (PSA) blood tests as part of routine cancer  screening. Talk to your health care provider about prostate cancer screening.  Testicular cancer screening is not recommended for adolescents or adult males who have no symptoms. Screening includes self-exam, a health care provider exam, and other screening tests. Consult with your health care provider about any symptoms you have or any concerns you have about testicular cancer.  Practice safe sex. Use condoms and avoid high-risk sexual practices to reduce the spread of sexually transmitted infections (STIs).  You should be screened for STIs, including gonorrhea and chlamydia if:  You are sexually active and are younger than 24 years.  You are older than 24 years, and your health care provider tells you that you are at risk for this type of infection.  Your sexual activity has changed since you were last screened, and you are at an increased risk for chlamydia or gonorrhea. Ask your health care provider if you are at risk.  If you are at risk of being infected with HIV, it is recommended that you take a prescription medicine daily to prevent HIV infection. This is called pre-exposure prophylaxis (PrEP). You are considered at risk if:  You are a man who has sex with other men (MSM).  You are a heterosexual man who is sexually active with  multiple partners.  You take drugs by injection.  You are sexually active with a partner who has HIV.  Talk with your health care provider about whether you are at high risk of being infected with HIV. If you choose to begin PrEP, you should first be tested for HIV. You should then be tested every 3 months for as long as you are taking PrEP.  Use sunscreen. Apply sunscreen liberally and repeatedly throughout the day. You should seek shade when your shadow is shorter than you. Protect yourself by wearing long sleeves, pants, a wide-brimmed hat, and sunglasses year round whenever you are outdoors.  Tell your health care provider of new moles or changes in  moles, especially if there is a change in shape or color. Also, tell your health care provider if a mole is larger than the size of a pencil eraser.  A one-time screening for abdominal aortic aneurysm (AAA) and surgical repair of large AAAs by ultrasound is recommended for men aged 60-75 years who are current or former smokers.  Stay current with your vaccines (immunizations).   This information is not intended to replace advice given to you by your health care provider. Make sure you discuss any questions you have with your health care provider.   Document Released: 10/07/2007 Document Revised: 05/01/2014 Document Reviewed: 09/05/2010 Elsevier Interactive Patient Education [employer].

## 2015-06-24 NOTE — Progress Notes (Addendum)
Subjective:   [name] is a 75 y.o. male who presents for Medicare Annual/Subsequent preventive examination.  Review of Systems:  HRA assessment completed during visit; Rexanne Mano The Patient was informed that this wellness visit is to identify risk and educate on how to reduce risk for increase disease through lifestyle changes.   ROS deferred to CPE exam with physician Oldest son has 2 grands in 58 and 49 yo / Youngest son married; 2 step grand children; 27 and 6  Teacher here locally  Wife's family in [place]; mother is 16;  Has other family there  Medical issues  Married 52 years;  HTN: under control; lost weight and this helped BP  Hyperglycemia 03/2014; chol 162; Trig 44; HDL 72; LDL 80  A1c 5.6  COPD is his biggest issue and long term hx of smoking Tried to quit smoking in the past Taking the patches or med did not help  Have cut back a quarter of a pack now. Does not smoke in the house Discussed the pros and cons of smoking; expensive; can't smoke in the home  The patient noted he is just "bored"; was remodeling prior and very busy; now stays at home to assist wife if he can; Part of smoking "mechanical" or habit; Understands he can just plan to "do something else" when he is bored. Gave him the [place] quit line and educated on coaching.  Concerned about wife's health; coughing;   BMI: lost weight 26.4;  Diet; cut portions and now continues monitor how much he eats Skips lunch Breakfast; cereal; bacon and eggs; pancakes Lunch; drinking fluids during the day Supper; eats at home; vegetables, meat, salad Sweets; doesn't eat many  Exercise; doesn't do what I used to do Liked doing home improvement projects;  2300 square foot basement; sized down now  SAFETY; downsized; 1500 sq feet; single family home Safety reviewed for the home reviewed  Removal of clutter clearing paths through the home,  Railing /grab bars as needed in bathrooms to assist with getting in and out of  tub. Community safety; yes Smoke detectors yes Firearms safety / reviewed for safety if they exist  Driving accidents and seatbelt/ no Sun protection/ wears a hat Stressors 1-5; 3 to 4;  Wife has been ill; coughing x one month; She is not sleeping; Very weak; weight seems to be dropping; will not come in to see Dr. Ronnald Ramp. Will given her a call   Medication review/ the patient had his meds and takes everything as prescribed;   Fall assessment; fall x 1 and fx rib x 2; states he tripped over moving boxes  Year prior had disc issues about a year ago Gait assessment; good now  Mobilization and Functional losses in the last year./ can't do the heavy work he did do, also worries about spouse.  Sleep patterns; does not sleep well; Discussed ETOH at hs may inhibit sleep through the night. Discussed re-training the brain; went to bed early last pm and woke up at 12 mn and has been up since.   Urinary or fecal incontinence reviewed/ no   Counseling: Colonoscopy; 02/2010 EKG: 01/2015 Hearing: 4000hz  both eyes Ophthalmology exam; Dr. Katy Fitch; to schedule Immunizations up to date  Health advice or referrals Discussed ED / testosterone and to fup with Dr. Ronnald Ramp to review Also coached on smoking cessation;  Made apt with Dr. Ronnald Ramp when leaving today  Current Care Team reviewed and updated  Cardiac Risk Factors include: advanced age (>42men, >  65 women);hypertension;male gender;smoking/ tobacco exposure     Objective:    Vitals: BP 148/80 mmHg  Ht 6\' 2"  (1.88 m)  Wt 206 lb 8 oz (93.668 kg)  BMI 26.50 kg/m2  Tobacco History  Smoking status  . Current Every Day Smoker -- 1.00 packs/day for 50 years  . Types: Cigarettes  Smokeless tobacco  . Never Used     Ready to quit: Not Answered Counseling given: Yes   Past Medical History  Diagnosis Date  . Hypertension   . Hyperlipidemia   . COPD (chronic obstructive pulmonary disease) ([place])   . Osteoarthritis   . Skin cancer      history of: BCC on nose  . Genital warts 2009   Past Surgical History  Procedure Laterality Date  . Inguinal hernia repair    . Nasal sinus surgery    . Tonsillectomy    . Wart removal with grafts from scrotum (tannenbam) in 2009  2009   Family History  Problem Relation Age of Onset  . Adopted: Yes  . Alcohol abuse Neg Hx   . Cancer Neg Hx   . Heart disease Neg Hx   . Hyperlipidemia Neg Hx   . Hypertension Neg Hx   . Stroke Neg Hx    History  Sexual Activity  . Sexual Activity: Yes    Outpatient Encounter Prescriptions as of 06/24/2015  Medication Sig  . albuterol (PROVENTIL HFA;VENTOLIN HFA) 108 (90 BASE) MCG/ACT inhaler Inhale 2 puffs into the lungs every 6 (six) hours as needed.  . Ascorbic Acid (VITAMIN C) 1000 MG tablet Take 1,000 mg by mouth daily.  Marland Kitchen aspirin 81 MG tablet Take 81 mg by mouth daily.    Marland Kitchen atorvastatin (LIPITOR) 80 MG tablet TAKE 1 TABLET BY MOUTH ONCE DAILY  . Dietary Management Product (VASCULERA) TABS Take 1 capsule by mouth daily.  . furosemide (LASIX) 40 MG tablet Take 1 tablet (40 mg total) by mouth 2 (two) times daily.  . potassium chloride SA (K-DUR,KLOR-CON) 20 MEQ tablet TAKE 1 TABLET BY MOUTH 3 TIMES DAILY  . SYMBICORT 160-4.5 MCG/ACT inhaler USE 2 PUFFS TWICE DAILY.  Marland Kitchen tiotropium (SPIRIVA HANDIHALER) 18 MCG inhalation capsule Place 1 capsule (18 mcg total) into inhaler and inhale daily.  . traZODone (DESYREL) 50 MG tablet Take 1 tablet (50 mg total) by mouth at bedtime as needed for sleep.  Marland Kitchen zolpidem (AMBIEN) 5 MG tablet Take 1 tablet (5 mg total) by mouth at bedtime as needed. for sleep (Patient not taking: Reported on 06/24/2015)   No facility-administered encounter medications on file as of 06/24/2015.    Activities of Daily Living In your present state of health, do you have any difficulty performing the following activities: 06/24/2015  Hearing? N  Vision? N  Difficulty concentrating or making decisions? N  Walking or climbing stairs? Y    Dressing or bathing? N  Doing errands, shopping? N  Preparing Food and eating ? N  Using the Toilet? N  In the past six months, have you accidently leaked urine? N  Do you have problems with loss of bowel control? N  Managing your Medications? N  Managing your Finances? N  Housekeeping or managing your Housekeeping? N    Patient Care Team: Janith Lima, MD as PCP - General   Assessment:    Assessment   Patient presents for yearly preventative medicine examination. Medicare questionnaire screening were completed, i.e. Functional; fall risk; depression, memory loss and hearing.   All immunizations and  health maintenance protocols were reviewed with the patient and needed orders were placed.  Education provided for laboratory screens;    Medication reconciliation, past medical history, social history, problem list and allergies were reviewed in detail with the patient  Goals were established with regard to smoking cessation;  End of life planning was discussed.   Exercise Activities and Dietary recommendations Current Exercise Habits:: Home exercise routine, Type of exercise: walking, Time (Minutes): 30 (walk 30 minutes 5 days a week ; going back and forth), Frequency (Times/Week): 5, Weekly Exercise (Minutes/Week): 150  Goals    . patient     Bored at times; concerned wife;  Would try to do things together with spouse; day excursions;         Fall Risk Fall Risk  06/24/2015 03/26/2014 01/08/2013  Falls in the past year? Yes No No  Number falls in past yr: 1 - -  Follow up Education provided - -   Depression Screen PHQ 2/9 Scores 03/26/2014 01/08/2013  PHQ - 2 Score 0 0    Cognitive Testing MMSE - Mini Mental State Exam 06/24/2015  Not completed: (No Data)   Ad8 score 0    Immunization History  Administered Date(s) Administered  . Influenza Split 01/03/2011  . Influenza Whole 02/01/2009, 03/29/2010, 01/18/2012  . Influenza, High Dose Seasonal PF 02/02/2015  .  Influenza,inj,Quad PF,36+ Mos 01/08/2013, 01/28/2014  . Pneumococcal Conjugate-13 01/08/2013  . Pneumococcal Polysaccharide-23 04/25/2007  . Td 11/07/2006  . Varicella 11/05/2007   Screening Tests Health Maintenance  Topic Date Due  . INFLUENZA VACCINE  11/23/2015  . TETANUS/TDAP  11/06/2016  . COLONOSCOPY  03/07/2020  . ZOSTAVAX  Addressed  . PNA vac Low Risk Adult  Completed      Plan:     Trying to work with pharmaceutical to get [employer] at generic rate.   Multiple bruising from ASA therapy;   Hobby; likes trains and in [employer] in area  The patient was not sure regarding colonoscopy;  did have repeat in 3 yaers Will note on next office visit with Dr. Ronnald Ramp to discuss  During the course of the visit the patient was educated and counseled about the following appropriate screening and preventive services:   Vaccines to include Pneumoccal, Influenza, Hepatitis B, Td, Zostavax, HCV are up to date  Electrocardiogram  01/2015  Cardiovascular Disease/ BP mod elevated today   Colorectal cancer screening/ 02/2010; the last exam reviewed and repeat is for 3 years; Talked to the patient and was hoping he did not have to have another. Can discuss with Dr. Ronnald Ramp when he comes in.   Diabetes screening/ 5.6 in 03/2014  Prostate Cancer Screening/ 03/2014  Glaucoma screening To schedule eye exam  Nutrition counseling / manages weight; monitors portions   Smoking cessation counseling/ discussed and coached; Given [place] quit line  Frustrated with insomnia and ED; ? Discuss testosterone if applicable   Patient Instructions (the written plan) was given to the patient.    Wynetta Fines, RN  06/24/2015   Medical screening examination/treatment/procedure(s) were performed by non-physician practitioner and as supervising physician I was immediately available for consultation/collaboration. I agree with above. Scarlette Calico, MD

## 2015-07-02 ENCOUNTER — Telehealth: Payer: Self-pay | Admitting: Internal Medicine

## 2015-07-02 DIAGNOSIS — I872 Venous insufficiency (chronic) (peripheral): Secondary | ICD-10-CM

## 2015-07-02 NOTE — Telephone Encounter (Signed)
States patient is going to be calling to have [facility]. Would like Dr. Ronnald Ramp to leave two boxes of samples (his supply) for patient to pick up and will bring two more boxes back to replace Dr. Ronnald Ramp supply.

## 2015-07-05 MED ORDER — VASCULERA PO TABS: 1.0000 | ORAL_TABLET | Freq: Every day | ORAL | Status: AC

## 2015-07-05 NOTE — Telephone Encounter (Signed)
Rx sent I have plenty of samples for him

## 2015-07-05 NOTE — Telephone Encounter (Signed)
Notified pt MD sent rx to pharmacy also left samples for pick-up...[name]

## 2015-07-28 ENCOUNTER — Other Ambulatory Visit: Payer: Self-pay | Admitting: Internal Medicine

## 2015-08-02 ENCOUNTER — Ambulatory Visit: Payer: PPO | Admitting: Internal Medicine

## 2015-08-04 ENCOUNTER — Ambulatory Visit (INDEPENDENT_AMBULATORY_CARE_PROVIDER_SITE_OTHER): Payer: PPO | Admitting: Internal Medicine

## 2015-08-04 ENCOUNTER — Encounter: Payer: Self-pay | Admitting: Internal Medicine

## 2015-08-04 ENCOUNTER — Other Ambulatory Visit (INDEPENDENT_AMBULATORY_CARE_PROVIDER_SITE_OTHER): Payer: PPO

## 2015-08-04 VITALS — BP 160/70 | HR 86 | Temp 98.1°F | Resp 16 | Ht 74.0 in | Wt 203.0 lb

## 2015-08-04 DIAGNOSIS — G4700 Insomnia, unspecified: Secondary | ICD-10-CM | POA: Diagnosis not present

## 2015-08-04 DIAGNOSIS — I10 Essential (primary) hypertension: Secondary | ICD-10-CM

## 2015-08-04 DIAGNOSIS — N189 Chronic kidney disease, unspecified: Secondary | ICD-10-CM

## 2015-08-04 DIAGNOSIS — N182 Chronic kidney disease, stage 2 (mild): Secondary | ICD-10-CM

## 2015-08-04 LAB — BASIC METABOLIC PANEL
BUN: 11 mg/dL (ref 6–23)
CHLORIDE: 98 meq/L (ref 96–112)
CO2: 32 mEq/L (ref 19–32)
CREATININE: 0.76 mg/dL (ref 0.40–1.50)
Calcium: 9.3 mg/dL (ref 8.4–10.5)
GFR: 105.96 mL/min (ref >60.00)
Glucose, Bld: 100 mg/dL — ABNORMAL HIGH (ref 70–99)
Potassium: 4.3 mEq/L (ref 3.5–5.1)
Sodium: 137 mEq/L (ref 135–145)

## 2015-08-04 MED ORDER — SUVOREXANT 15 MG PO TABS: 1.0000 | ORAL_TABLET | Freq: Every evening | ORAL | Status: DC | PRN

## 2015-08-04 NOTE — Progress Notes (Signed)
Subjective:  Patient ID: [name], male    DOB: March 12, 1941  Age: 75 y.o. MRN: [id number]  CC: Hypertension and COPD   HPI [name] presents for a blood pressure check and follow-up on COPD. He had an upper respiratory infection a few weeks ago but tells me those symptoms have resolved, he currently does not complain of cough, fever, chills, congestion, shortness of breath, or wheezing. He also tells me his blood pressure has been well controlled. The swelling in his legs has not improved much but he is also not taking the furosemide twice a day. He only takes it once a day. His main complaint today is insomnia with difficulty falling asleep and staying asleep. He has tried trazodone and Ambien with no relief from his symptoms. He complains of his fatigue during the day that he thinks is attributable not sleeping well at night.  Outpatient Prescriptions Prior to Visit  Medication Sig Dispense Refill  . albuterol (PROVENTIL HFA;VENTOLIN HFA) 108 (90 BASE) MCG/ACT inhaler Inhale 2 puffs into the lungs every 6 (six) hours as needed. 1 Inhaler 11  . Ascorbic Acid (VITAMIN C) 1000 MG tablet Take 1,000 mg by mouth daily.    Marland Kitchen aspirin 81 MG tablet Take 81 mg by mouth daily.      Marland Kitchen atorvastatin (LIPITOR) 80 MG tablet TAKE 1 TABLET ONCE DAILY. 90 tablet 0  . Dietary Management Product (VASCULERA) TABS Take 1 capsule by mouth daily. 30 tablet 11  . furosemide (LASIX) 40 MG tablet Take 1 tablet (40 mg total) by mouth 2 (two) times daily. 60 tablet 5  . potassium chloride SA (K-DUR,KLOR-CON) 20 MEQ tablet TAKE 1 TABLET BY MOUTH 3 TIMES DAILY 90 tablet 6  . SYMBICORT 160-4.5 MCG/ACT inhaler USE 2 PUFFS TWICE DAILY. 10.2 g 11  . tiotropium (SPIRIVA HANDIHALER) 18 MCG inhalation capsule Place 1 capsule (18 mcg total) into inhaler and inhale daily. 30 capsule 11  . traZODone (DESYREL) 50 MG tablet Take 1 tablet (50 mg total) by mouth at bedtime as needed for sleep. 90 tablet 3  . zolpidem (AMBIEN) 5 MG  tablet Take 1 tablet (5 mg total) by mouth at bedtime as needed. for sleep (Patient not taking: Reported on 06/24/2015) 30 tablet 3   No facility-administered medications prior to visit.    ROS Review of Systems  Constitutional: Negative.  Negative for fever, chills, diaphoresis, appetite change and fatigue.  HENT: Negative.   Eyes: Negative.   Respiratory: Negative.  Negative for cough, choking, chest tightness, shortness of breath and stridor.   Cardiovascular: Positive for leg swelling.  Gastrointestinal: Negative.  Negative for nausea, vomiting, abdominal pain, diarrhea, constipation and blood in stool.  Endocrine: Negative.   Genitourinary: Negative.  Negative for urgency, frequency, hematuria, flank pain, decreased urine volume and difficulty urinating.  Musculoskeletal: Negative.  Negative for back pain, arthralgias, neck pain and neck stiffness.  Skin: Negative.  Negative for color change, pallor and rash.  Allergic/Immunologic: Negative.   Neurological: Negative.  Negative for dizziness, tremors, weakness, light-headedness, numbness and headaches.  Hematological: Negative.  Negative for adenopathy. Does not bruise/bleed easily.  Psychiatric/Behavioral: Positive for sleep disturbance. Negative for suicidal ideas, confusion, dysphoric mood, decreased concentration and agitation. The patient is not nervous/anxious.     Objective:  BP 160/70 mmHg  Pulse 86  Temp(Src) 98.1 F (36.7 C) (Oral)  Resp 16  Ht 6\' 2"  (1.88 m)  Wt 203 lb (92.08 kg)  BMI 26.05 kg/m2  SpO2 95%  BP Readings from Last 3 Encounters:  08/04/15 160/70  06/24/15 148/80  02/02/15 168/78    Wt Readings from Last 3 Encounters:  08/04/15 203 lb (92.08 kg)  06/24/15 206 lb 8 oz (93.668 kg)  02/02/15 203 lb (92.08 kg)    Physical Exam  Constitutional: He is oriented to person, place, and time.  Non-toxic appearance. He does not have a sickly appearance. He does not appear ill. No distress.  HENT:    Mouth/Throat: Oropharynx is clear and moist. No oropharyngeal exudate.  Eyes: Conjunctivae are normal. Right eye exhibits no discharge. Left eye exhibits no discharge. No scleral icterus.  Neck: Normal range of motion. Neck supple. No JVD present. No tracheal deviation present. No thyromegaly present.  Cardiovascular: Normal rate, regular rhythm, normal heart sounds and intact distal pulses.  Exam reveals no gallop and no friction rub.   No murmur heard. Pulmonary/Chest: Effort normal and breath sounds normal. No stridor. No respiratory distress. He has no wheezes. He has no rales. He exhibits no tenderness.  Abdominal: Soft. Bowel sounds are normal. He exhibits no distension and no mass. There is no tenderness. There is no rebound and no guarding.  Musculoskeletal: Normal range of motion. He exhibits edema (1+ pitting edema in BLE). He exhibits no tenderness.  Lymphadenopathy:    He has no cervical adenopathy.  Neurological: He is oriented to person, place, and time.  Skin: Skin is warm and dry. No rash noted. He is not diaphoretic. No erythema. No pallor.  Vitals reviewed.   Lab Results  Component Value Date   WBC 6.6 02/02/2015   HGB 16.0 02/02/2015   HCT 46.5 02/02/2015   PLT 195.0 02/02/2015   GLUCOSE 100* 08/04/2015   CHOL 162 03/25/2014   TRIG 44.0 03/25/2014   HDL 72.90 03/25/2014   LDLCALC 80 03/25/2014   ALT 18 02/02/2015   AST 23 02/02/2015   NA 137 08/04/2015   K 4.3 08/04/2015   CL 98 08/04/2015   CREATININE 0.76 08/04/2015   BUN 11 08/04/2015   CO2 32 08/04/2015   TSH 1.03 02/02/2015   PSA 1.36 03/25/2014   HGBA1C 5.6 03/25/2014    Dg Ribs Unilateral W/chest Left  12/29/2014  CLINICAL DATA:  Fall, left lower anterior chest wall pain EXAM: LEFT RIBS AND CHEST - 3+ VIEW COMPARISON:  09/24/2014 FINDINGS: Calcified hilar lymph nodes and bilateral pulmonary granulomas reidentified. Heart size normal. No pleural effusion. Linear lucency suggests nondisplaced fracture  of the left lateral seventh rib. On the second view, there is also likely nondisplaced fracture of the left lateral eighth rib. No pneumothorax. IMPRESSION: Nondisplaced fractures, left lateral seventh and eighth ribs. Electronically Signed   By: Conchita Paris M.D.   On: 12/29/2014 11:24    Assessment & Plan:   [name] was seen today for hypertension and copd.  Diagnoses and all orders for this visit:  Essential hypertension- His blood pressure is not adequately well-controlled, electrolytes and renal function are stable. If he wants to improve his blood pressure control and reduction of edema I encouraged him to take the Lasix twice a day as prescribed. -     Basic metabolic panel; Future  Chronic renal insufficiency, stage II (mild)- his renal function is stable/improved. His blood pressure is well-controlled, he will continue to avoid nephrotoxic agents. -     Basic metabolic panel; Future  Insomnia- he will discontinue trazodone and Ambien, will try Belsomra for treatment of insomnia. -     Suvorexant (BELSOMRA) 15  MG TABS; Take 1 tablet by mouth at bedtime as needed.   I have discontinued Mr. Ferrebee traZODone and zolpidem. I am also having him start on Suvorexant. Additionally, I am having him maintain his aspirin, albuterol, potassium chloride SA, tiotropium, furosemide, SYMBICORT, vitamin C, VASCULERA, and atorvastatin.  Meds ordered this encounter  Medications  . Suvorexant (BELSOMRA) 15 MG TABS    Sig: Take 1 tablet by mouth at bedtime as needed.    Dispense:  30 tablet    Refill:  5     Follow-up: Return in about 6 months (around 02/03/2016).  Scarlette Calico, MD

## 2015-08-04 NOTE — Progress Notes (Signed)
Pre visit review using our clinic review tool, if applicable. No additional management support is needed unless otherwise documented below in the visit note. 

## 2015-08-04 NOTE — Patient Instructions (Signed)
Hypertension Hypertension, commonly called high blood pressure, is when the force of blood pumping through your arteries is too strong. Your arteries are the blood vessels that carry blood from your heart throughout your body. A blood pressure reading consists of a higher number over a lower number, such as 110/72. The higher number (systolic) is the pressure inside your arteries when your heart pumps. The lower number (diastolic) is the pressure inside your arteries when your heart relaxes. Ideally you want your blood pressure below 120/80. Hypertension forces your heart to work harder to pump blood. Your arteries may become narrow or stiff. Having untreated or uncontrolled hypertension can cause heart attack, stroke, kidney disease, and other problems. RISK FACTORS Some risk factors for high blood pressure are controllable. Others are not.  Risk factors you cannot control include:   Race. You may be at higher risk if you are African American.  Age. Risk increases with age.  Gender. Men are at higher risk than women before age 45 years. After age 65, women are at higher risk than men. Risk factors you can control include:  Not getting enough exercise or physical activity.  Being overweight.  Getting too much fat, sugar, calories, or salt in your diet.  Drinking too much alcohol. SIGNS AND SYMPTOMS Hypertension does not usually cause signs or symptoms. Extremely high blood pressure (hypertensive crisis) may cause headache, anxiety, shortness of breath, and nosebleed. DIAGNOSIS To check if you have hypertension, your health care provider will measure your blood pressure while you are seated, with your arm held at the level of your heart. It should be measured at least twice using the same arm. Certain conditions can cause a difference in blood pressure between your right and left arms. A blood pressure reading that is higher than normal on one occasion does not mean that you need treatment. If  it is not clear whether you have high blood pressure, you may be asked to return on a different day to have your blood pressure checked again. Or, you may be asked to monitor your blood pressure at home for 1 or more weeks. TREATMENT Treating high blood pressure includes making lifestyle changes and possibly taking medicine. Living a healthy lifestyle can help lower high blood pressure. You may need to change some of your habits. Lifestyle changes may include:  Following the DASH diet. This diet is high in fruits, vegetables, and whole grains. It is low in salt, red meat, and added sugars.  Keep your sodium intake below 2,300 mg per day.  Getting at least 30-45 minutes of aerobic exercise at least 4 times per week.  Losing weight if necessary.  Not smoking.  Limiting alcoholic beverages.  Learning ways to reduce stress. Your health care provider may prescribe medicine if lifestyle changes are not enough to get your blood pressure under control, and if one of the following is true:  You are 18-59 years of age and your systolic blood pressure is above 140.  You are 60 years of age or older, and your systolic blood pressure is above 150.  Your diastolic blood pressure is above 90.  You have diabetes, and your systolic blood pressure is over 140 or your diastolic blood pressure is over 90.  You have kidney disease and your blood pressure is above 140/90.  You have heart disease and your blood pressure is above 140/90. Your personal target blood pressure may vary depending on your medical conditions, your age, and other factors. HOME CARE INSTRUCTIONS    Have your blood pressure rechecked as directed by your health care provider.   Take medicines only as directed by your health care provider. Follow the directions carefully. Blood pressure medicines must be taken as prescribed. The medicine does not work as well when you skip doses. Skipping doses also puts you at risk for  problems.  Do not smoke.   Monitor your blood pressure at home as directed by your health care provider. SEEK MEDICAL CARE IF:   You think you are having a reaction to medicines taken.  You have recurrent headaches or feel dizzy.  You have swelling in your ankles.  You have trouble with your vision. SEEK IMMEDIATE MEDICAL CARE IF:  You develop a severe headache or confusion.  You have unusual weakness, numbness, or feel faint.  You have severe chest or abdominal pain.  You vomit repeatedly.  You have trouble breathing. MAKE SURE YOU:   Understand these instructions.  Will watch your condition.  Will get help right away if you are not doing well or get worse.   This information is not intended to replace advice given to you by your health care provider. Make sure you discuss any questions you have with your health care provider.   Document Released: 04/10/2005 Document Revised: 08/25/2014 Document Reviewed: 01/31/2013 Elsevier Interactive Patient Education 2016 [employer]  

## 2015-08-25 DIAGNOSIS — L570 Actinic keratosis: Secondary | ICD-10-CM | POA: Diagnosis not present

## 2015-09-29 DIAGNOSIS — S41101A Unspecified open wound of right upper arm, initial encounter: Secondary | ICD-10-CM | POA: Diagnosis not present

## 2015-09-30 DIAGNOSIS — H02834 Dermatochalasis of left upper eyelid: Secondary | ICD-10-CM | POA: Diagnosis not present

## 2015-09-30 DIAGNOSIS — H25813 Combined forms of age-related cataract, bilateral: Secondary | ICD-10-CM | POA: Diagnosis not present

## 2015-09-30 DIAGNOSIS — H02831 Dermatochalasis of right upper eyelid: Secondary | ICD-10-CM | POA: Diagnosis not present

## 2015-09-30 DIAGNOSIS — S41101D Unspecified open wound of right upper arm, subsequent encounter: Secondary | ICD-10-CM | POA: Diagnosis not present

## 2015-09-30 DIAGNOSIS — H40013 Open angle with borderline findings, low risk, bilateral: Secondary | ICD-10-CM | POA: Diagnosis not present

## 2015-11-10 DIAGNOSIS — A630 Anogenital (venereal) warts: Secondary | ICD-10-CM | POA: Diagnosis not present

## 2015-11-10 DIAGNOSIS — D692 Other nonthrombocytopenic purpura: Secondary | ICD-10-CM | POA: Diagnosis not present

## 2015-11-22 ENCOUNTER — Other Ambulatory Visit: Payer: Self-pay | Admitting: Internal Medicine

## 2015-12-29 ENCOUNTER — Other Ambulatory Visit: Payer: Self-pay | Admitting: Internal Medicine

## 2015-12-30 ENCOUNTER — Telehealth: Payer: Self-pay | Admitting: Internal Medicine

## 2015-12-30 NOTE — Telephone Encounter (Signed)
Pt called stating pharmacy never got rx for spiriva that we sent in. Please check and call pt back

## 2015-12-31 NOTE — Telephone Encounter (Signed)
Called pharmacy and they have not received the rx for the spiriva. Gave rx over the phone.   Called pt and informed of same.

## 2016-01-04 ENCOUNTER — Encounter: Payer: Self-pay | Admitting: Internal Medicine

## 2016-01-04 ENCOUNTER — Other Ambulatory Visit (INDEPENDENT_AMBULATORY_CARE_PROVIDER_SITE_OTHER): Payer: PPO

## 2016-01-04 ENCOUNTER — Ambulatory Visit (INDEPENDENT_AMBULATORY_CARE_PROVIDER_SITE_OTHER): Payer: PPO | Admitting: Internal Medicine

## 2016-01-04 VITALS — BP 128/82 | HR 86 | Temp 98.7°F | Resp 16 | Wt 189.0 lb

## 2016-01-04 DIAGNOSIS — Z23 Encounter for immunization: Secondary | ICD-10-CM | POA: Diagnosis not present

## 2016-01-04 DIAGNOSIS — I10 Essential (primary) hypertension: Secondary | ICD-10-CM

## 2016-01-04 DIAGNOSIS — M720 Palmar fascial fibromatosis [Dupuytren]: Secondary | ICD-10-CM

## 2016-01-04 DIAGNOSIS — R739 Hyperglycemia, unspecified: Secondary | ICD-10-CM

## 2016-01-04 LAB — BASIC METABOLIC PANEL
BUN: 10 mg/dL (ref 6–23)
CALCIUM: 10.4 mg/dL (ref 8.4–10.5)
CHLORIDE: 98 meq/L (ref 96–112)
CO2: 35 meq/L — AB (ref 19–32)
CREATININE: 0.86 mg/dL (ref 0.40–1.50)
GFR: 91.77 mL/min (ref >60.00)
GLUCOSE: 98 mg/dL (ref 70–99)
Potassium: 4.4 mEq/L (ref 3.5–5.1)
Sodium: 137 mEq/L (ref 135–145)

## 2016-01-04 LAB — HEMOGLOBIN A1C: Hgb A1c MFr Bld: 5.2 % (ref 4.6–6.5)

## 2016-01-04 LAB — MAGNESIUM: Magnesium: 1.7 mg/dL (ref 1.5–2.5)

## 2016-01-04 NOTE — Progress Notes (Signed)
Subjective:  Patient ID: [name], male    DOB: [birth year]  Age: 75 y.o. MRN: [id number]  CC: Cyst (left hand---palm area---has been there x1 week---just woke up one morning and it was there---doesn) and Hypertension   HPI [name] presents for concerns about 2 nodule2 on the palmar side of his left hand that he's noticed over the last several weeks. He is not having any trouble using his fingers. He says the lesions are noticeable but don't cause him any discomfort.  He tells me his blood pressures been well controlled. He has an unchanged level of shortness of breath and dyspnea on exertion but his had no recent episodes of cough, wheezing, hemoptysis, headache, blurred vision, chest pain, palpitations, edema, or fatigue.  Outpatient Medications Prior to Visit  Medication Sig Dispense Refill  . albuterol (PROVENTIL HFA;VENTOLIN HFA) 108 (90 BASE) MCG/ACT inhaler Inhale 2 puffs into the lungs every 6 (six) hours as needed. 1 Inhaler 11  . Ascorbic Acid (VITAMIN C) 1000 MG tablet Take 1,000 mg by mouth daily.    Marland Kitchen aspirin 81 MG tablet Take 81 mg by mouth daily.      Marland Kitchen atorvastatin (LIPITOR) 80 MG tablet TAKE 1 TABLET ONCE DAILY. 90 tablet 3  . Dietary Management Product (VASCULERA) TABS Take 1 capsule by mouth daily. 30 tablet 11  . furosemide (LASIX) 40 MG tablet Take 1 tablet (40 mg total) by mouth 2 (two) times daily. 60 tablet 5  . potassium chloride SA (K-DUR,KLOR-CON) 20 MEQ tablet TAKE 1 TABLET BY MOUTH 3 TIMES DAILY 90 tablet 6  . SPIRIVA HANDIHALER 18 MCG inhalation capsule INHALE CONTENTS OF 1 CAPSULE DAILY AS DIRECTED 30 capsule 11  . SYMBICORT 160-4.5 MCG/ACT inhaler USE 2 PUFFS TWICE DAILY. 10.2 g 11  . Suvorexant (BELSOMRA) 15 MG TABS Take 1 tablet by mouth at bedtime as needed. (Patient not taking: Reported on 01/04/2016) 30 tablet 5   No facility-administered medications prior to visit.     ROS Review of Systems  Constitutional: Negative for appetite change,  chills, diaphoresis, fatigue and fever.  HENT: Negative.   Eyes: Negative.   Respiratory: Positive for shortness of breath. Negative for cough, choking, chest tightness, wheezing and stridor.   Cardiovascular: Negative for chest pain, palpitations and leg swelling.  Gastrointestinal: Negative.  Negative for abdominal pain, constipation, diarrhea, nausea and vomiting.  Endocrine: Negative.   Genitourinary: Negative.   Musculoskeletal: Negative.  Negative for back pain, gait problem, myalgias and neck pain.  Skin: Negative.  Negative for color change and rash.  Allergic/Immunologic: Negative.   Neurological: Negative.  Negative for dizziness, weakness, numbness and headaches.  Hematological: Negative.  Negative for adenopathy. Does not bruise/bleed easily.  Psychiatric/Behavioral: Negative.     Objective:  BP 128/82   Pulse 86   Temp 98.7 F (37.1 C) (Oral)   Resp 16   Wt 189 lb (85.7 kg)   SpO2 97%   BMI 24.27 kg/m   BP Readings from Last 3 Encounters:  01/04/16 128/82  08/04/15 (!) 160/70  06/24/15 (!) 148/80    Wt Readings from Last 3 Encounters:  01/04/16 189 lb (85.7 kg)  08/04/15 203 lb (92.1 kg)  06/24/15 206 lb 8 oz (93.7 kg)    Physical Exam  Constitutional: He is oriented to person, place, and time. No distress.  HENT:  Mouth/Throat: Oropharynx is clear and moist. No oropharyngeal exudate.  Eyes: Conjunctivae are normal. Right eye exhibits no discharge. Left eye exhibits no  discharge. No scleral icterus.  Neck: Normal range of motion. Neck supple. No JVD present. No tracheal deviation present. No thyromegaly present.  Cardiovascular: Normal rate, regular rhythm, normal heart sounds and intact distal pulses.  Exam reveals no gallop and no friction rub.   No murmur heard. Pulmonary/Chest: Effort normal and breath sounds normal. No stridor. No respiratory distress. He has no wheezes. He has no rales. He exhibits no tenderness.  Abdominal: Soft. Bowel sounds are  normal. He exhibits no distension and no mass. There is no tenderness. There is no rebound and no guarding.  Musculoskeletal: Normal range of motion. He exhibits no edema, tenderness or deformity.       Hands: Lymphadenopathy:    He has no cervical adenopathy.  Neurological: He is oriented to person, place, and time.  Skin: Skin is warm and dry. No rash noted. He is not diaphoretic. No erythema. No pallor.  Vitals reviewed.   Lab Results  Component Value Date   WBC 6.6 02/02/2015   HGB 16.0 02/02/2015   HCT 46.5 02/02/2015   PLT 195.0 02/02/2015   GLUCOSE 98 01/04/2016   CHOL 162 03/25/2014   TRIG 44.0 03/25/2014   HDL 72.90 03/25/2014   LDLCALC 80 03/25/2014   ALT 18 02/02/2015   AST 23 02/02/2015   NA 137 01/04/2016   K 4.4 01/04/2016   CL 98 01/04/2016   CREATININE 0.86 01/04/2016   BUN 10 01/04/2016   CO2 35 (H) 01/04/2016   TSH 1.03 02/02/2015   PSA 1.36 03/25/2014   HGBA1C 5.2 01/04/2016    Dg Ribs Unilateral W/chest Left  Result Date: 12/29/2014 CLINICAL DATA:  Fall, left lower anterior chest wall pain EXAM: LEFT RIBS AND CHEST - 3+ VIEW COMPARISON:  09/24/2014 FINDINGS: Calcified hilar lymph nodes and bilateral pulmonary granulomas reidentified. Heart size normal. No pleural effusion. Linear lucency suggests nondisplaced fracture of the left lateral seventh rib. On the second view, there is also likely nondisplaced fracture of the left lateral eighth rib. No pneumothorax. IMPRESSION: Nondisplaced fractures, left lateral seventh and eighth ribs. Electronically Signed   By: Conchita Paris M.D.   On: 12/29/2014 11:24    Assessment & Plan:   Sincer was seen today for cyst and hypertension.  Diagnoses and all orders for this visit:  Need for prophylactic vaccination and inoculation against influenza -     Flu vaccine HIGH DOSE PF (Fluzone High dose)  Need for prophylactic vaccination against Streptococcus pneumoniae (pneumococcus) -     Pneumococcal polysaccharide  vaccine 23-valent greater than or equal to 2yo subcutaneous/IM  Dupuytren's contracture of left hand- I have asked to see a hand surgeon to see if there is anything that can be done to treat this such as a steroid injection or surgery -     Ambulatory referral to Orthopedic Surgery  Essential hypertension- his blood pressure is well-controlled, electrolytes and renal function are stable. -     Basic metabolic panel; Future -     Magnesium; Future  Hyperglycemia- improvement noted. -     Basic metabolic panel; Future -     Hemoglobin A1c; Future   I have discontinued Mr. Cotton Suvorexant. I am also having him maintain his aspirin, albuterol, potassium chloride SA, furosemide, SYMBICORT, vitamin C, VASCULERA, atorvastatin, and SPIRIVA HANDIHALER.  No orders of the defined types were placed in this encounter.    Follow-up: Return in about 4 months (around 05/05/2016).  Scarlette Calico, MD

## 2016-01-04 NOTE — Patient Instructions (Signed)
Dupuytren's Contracture Dupuytren's contracture affects the fingers and the palm of the hand. This condition usually develops slowly. It may take many years to develop. The pinky finger and the ring finger are most often affected. These fingers start to curve inward, like a claw. At some point, the fingers cannot go straight anymore. This can make it hard to do things like:  Put on gloves.  Shake hands.  Grab something off a shelf. The condition usually does not cause pain and is not dangerous. The condition gets its name from the doctor who came up with an operation to fix the problem. His name was Baron Guillaume Dupuytren. Contracture means pulling inward. CAUSES  Dupuytren's contracture does not start with the fingers. It starts in the palm of the hand, under the skin. The tissue under the skin is called fascia. The fascia covers the cords (tendons) that control how the fingers move. In Dupuytren's contracture the fascia tissue becomes thick and then pulls on the cords. That causes the fingers to curl. The condition can affect both hands and any fingers, but it usually strikes one hand worse than the other. The fingers farthest from the thumb are most often the ones that curl. The cause is not clear. Some experts believe it results from an autoimmune reaction. That means the body's immune system (which fights off disease) attacks itself by mistake. What experts do know is that certain conditions and behaviors (called risk factors) make the chance of having this condition more likely. They include:  Age. Most people who have the condition are older than 50.  Sex. It affects men more often than women.  Family history. The condition tends to run in families from countries in [place].  Certain behaviors. People who smoke and drink alcohol are more apt to develop the problem.  Some other medical conditions. Having diabetes makes Dupuytren's contracture more likely. So does  having a condition that involves a seizure (when the brain's function is interrupted). SYMPTOMS  Signs of this condition take time to develop. Sometimes this takes weeks or months. More often, it takes several years.   Early symptoms:  Skin on the palm of the hand becomes thick. This is usually the first sign.  The skin may look dimpled or puckered.  Lumps (nodules) show up on the palm. There may be one or more lumps. They are not painful.  Later symptoms:  Thick cords of tissue form in the palm of the hand.  The pinky and ring fingers start to curl up into the palm.  The fingers cannot be straightened into their normal position. DIAGNOSIS  A physical examination is the main way that a healthcare provider can tell if you have Dupuytren's contracture. Other tests usually are not needed. The caregiver will probably:  Look at your hands. Feel your hands. This is to check for thickening and nodules.  Measure finger motion. This tells how much your fingers have contracted (pulled in).  Do a tabletop test. You will be asked to try to put your hand flat on a table, palm down. TREATMENT  There is no cure for Dupuytren's contracture. But there are ways to treat the symptoms. Options include:  Watching and waiting. The condition develops slowly. Often it does not create problems for a long time. Sometimes the skin gets thick and nodules form, but the fingers never curl. So, in some cases it is best to just watch the condition carefully and wait to see what happens.    for a long time. Sometimes the skin gets thick and nodules form, but the fingers never curl. So, in some cases it is best to just watch the condition carefully and wait to see what happens.  · Shots (injections). Different substances may be injected, including:    Steroids. These drugs block swelling. These shots should make the condition less uncomfortable. Steroids may also slow down the condition. Shots are given into the nodules. The effect only lasts awhile. More shots may have to be given.    Enzymes. These are proteins. They weaken the thick tissue. After an injection, the caregiver usually stretches the  fingers.  · Needling. A needle is pushed through the skin and into the thick tissue. This is done in several spots. The goal is to break up the thickened tissue. Or to weaken it.  · Surgery. This may be suggested if you cannot grasp objects. Or, if you can no longer put your hand in your pocket.    A cut (incision) is made in the palm of the hand. The thick tissue is removed.    Sometimes the thick tissue is attached to the skin. Then, the skin must be removed, too. It is replaced with a piece of skin from another place on your body. That is called a skin graft.    Occupational or hand therapy is almost always needed after surgery. This involves special exercises to get back the use of your hand and fingers. After a skin graft, several months of therapy may be needed.    Sometimes the condition comes back, even after surgery.  · Other methods. You can do some things on your own. They include:    Stretching the fingers backwards. Do this often.    Warming the hand and massaging it. Again, do this often.    Using tools with padded grips. This should make things easier.    Wearing heavy gloves while working. This protects the hands.  PROGNOSIS   Dupuytren's contracture usually develops slowly. There is no cure. But, the symptoms can be treated. Sometimes they come back after treatment, but not always. It is important to remember that this is a functional problem and not a life-threatening condition.     This information is not intended to replace advice given to you by your health care provider. Make sure you discuss any questions you have with your health care provider.     Document Released: 02/05/2009 Document Revised: 05/01/2014 Document Reviewed: 02/05/2009  Elsevier Interactive Patient Education ©2016 [employer]

## 2016-01-04 NOTE — Progress Notes (Signed)
Pre visit review using our clinic review tool, if applicable. No additional management support is needed unless otherwise documented below in the visit note. 

## 2016-01-05 ENCOUNTER — Encounter: Payer: Self-pay | Admitting: Internal Medicine

## 2016-01-10 ENCOUNTER — Other Ambulatory Visit: Payer: Self-pay | Admitting: Internal Medicine

## 2016-01-19 DIAGNOSIS — M720 Palmar fascial fibromatosis [Dupuytren]: Secondary | ICD-10-CM | POA: Diagnosis not present

## 2016-02-01 ENCOUNTER — Ambulatory Visit: Payer: PPO | Admitting: Internal Medicine

## 2016-02-05 ENCOUNTER — Other Ambulatory Visit: Payer: Self-pay | Admitting: Internal Medicine

## 2016-02-16 ENCOUNTER — Encounter: Payer: Self-pay | Admitting: Internal Medicine

## 2016-02-16 ENCOUNTER — Other Ambulatory Visit (INDEPENDENT_AMBULATORY_CARE_PROVIDER_SITE_OTHER): Payer: PPO

## 2016-02-16 ENCOUNTER — Ambulatory Visit (INDEPENDENT_AMBULATORY_CARE_PROVIDER_SITE_OTHER): Payer: PPO | Admitting: Internal Medicine

## 2016-02-16 VITALS — BP 148/86 | HR 100 | Temp 98.1°F | Ht 74.0 in | Wt 185.0 lb

## 2016-02-16 DIAGNOSIS — Z0000 Encounter for general adult medical examination without abnormal findings: Secondary | ICD-10-CM

## 2016-02-16 DIAGNOSIS — R945 Abnormal results of liver function studies: Secondary | ICD-10-CM

## 2016-02-16 DIAGNOSIS — E785 Hyperlipidemia, unspecified: Secondary | ICD-10-CM

## 2016-02-16 DIAGNOSIS — N400 Enlarged prostate without lower urinary tract symptoms: Secondary | ICD-10-CM

## 2016-02-16 DIAGNOSIS — I10 Essential (primary) hypertension: Secondary | ICD-10-CM

## 2016-02-16 DIAGNOSIS — R739 Hyperglycemia, unspecified: Secondary | ICD-10-CM | POA: Diagnosis not present

## 2016-02-16 DIAGNOSIS — F17200 Nicotine dependence, unspecified, uncomplicated: Secondary | ICD-10-CM

## 2016-02-16 DIAGNOSIS — R7989 Other specified abnormal findings of blood chemistry: Secondary | ICD-10-CM | POA: Diagnosis not present

## 2016-02-16 DIAGNOSIS — R609 Edema, unspecified: Secondary | ICD-10-CM

## 2016-02-16 LAB — CBC WITH DIFFERENTIAL/PLATELET
BASOS ABS: 0 10*3/uL (ref 0.0–0.1)
Basophils Relative: 0.4 % (ref 0.0–3.0)
Eosinophils Absolute: 0.1 10*3/uL (ref 0.0–0.7)
Eosinophils Relative: 1.4 % (ref 0.0–5.0)
HCT: 43.4 % (ref 39.0–52.0)
Hemoglobin: 14.9 g/dL (ref 13.0–17.0)
LYMPHS ABS: 1.2 10*3/uL (ref 0.7–4.0)
Lymphocytes Relative: 18.4 % (ref 12.0–46.0)
MCHC: 34.3 g/dL (ref 30.0–36.0)
MCV: 97.7 fl (ref 78.0–100.0)
MONO ABS: 0.6 10*3/uL (ref 0.1–1.0)
Monocytes Relative: 10.3 % (ref 3.0–12.0)
NEUTROS PCT: 69.5 % (ref 43.0–77.0)
Neutro Abs: 4.3 10*3/uL (ref 1.4–7.7)
Platelets: 192 10*3/uL (ref 150.0–400.0)
RBC: 4.44 Mil/uL (ref 4.22–5.81)
RDW: 15.1 % (ref 11.5–15.5)
WBC: 6.3 10*3/uL (ref 4.0–10.5)

## 2016-02-16 LAB — COMPREHENSIVE METABOLIC PANEL
ALBUMIN: 4.3 g/dL (ref 3.5–5.2)
ALK PHOS: 49 U/L (ref 39–117)
ALT: 85 U/L — AB (ref 0–53)
AST: 129 U/L — ABNORMAL HIGH (ref 0–37)
BILIRUBIN TOTAL: 1.2 mg/dL (ref 0.2–1.2)
BUN: 11 mg/dL (ref 6–23)
CO2: 33 mEq/L — ABNORMAL HIGH (ref 19–32)
Calcium: 10 mg/dL (ref 8.4–10.5)
Chloride: 95 mEq/L — ABNORMAL LOW (ref 96–112)
Creatinine, Ser: 0.93 mg/dL (ref 0.40–1.50)
GFR: 83.82 mL/min (ref >60.00)
GLUCOSE: 92 mg/dL (ref 70–99)
Potassium: 3.6 mEq/L (ref 3.5–5.1)
SODIUM: 138 meq/L (ref 135–145)
TOTAL PROTEIN: 6.5 g/dL (ref 6.0–8.3)

## 2016-02-16 LAB — URINALYSIS, ROUTINE W REFLEX MICROSCOPIC
Bilirubin Urine: NEGATIVE
KETONES UR: NEGATIVE
LEUKOCYTES UA: NEGATIVE
Nitrite: NEGATIVE
PH: 6.5 (ref 5.0–8.0)
TOTAL PROTEIN, URINE-UPE24: NEGATIVE
URINE GLUCOSE: NEGATIVE
UROBILINOGEN UA: 0.2 (ref 0.0–1.0)

## 2016-02-16 LAB — LIPID PANEL
CHOLESTEROL: 180 mg/dL (ref 0–200)
HDL: 130 mg/dL (ref >39.00)
LDL Cholesterol: 41 mg/dL (ref 0–99)
NONHDL: 50.47
Total CHOL/HDL Ratio: 1
Triglycerides: 47 mg/dL (ref 0.0–149.0)
VLDL: 9.4 mg/dL (ref 0.0–40.0)

## 2016-02-16 LAB — TSH: TSH: 1.37 u[IU]/mL (ref 0.35–4.50)

## 2016-02-16 LAB — PSA: PSA: 1.86 ng/mL (ref 0.10–4.00)

## 2016-02-16 NOTE — Progress Notes (Signed)
Subjective:  Patient ID: [name], male    DOB: 10/07/1940  Age: 75 y.o. MRN: [id number]  CC: Annual Exam (annual exam)   HPI ALISTAIR SOLIZ presents for an AWV/CPX.  He complains of a few pound unexplained weight loss and fatigue over the last few months. His SOB and DOE are at his baseline. He denies cough.   Past Medical History:  Diagnosis Date  . COPD (chronic obstructive pulmonary disease) ([place])   . Genital warts 2009  . Hyperlipidemia   . Hypertension   . Osteoarthritis   . Skin cancer    history of: BCC on nose   Past Surgical History:  Procedure Laterality Date  . INGUINAL HERNIA REPAIR    . NASAL SINUS SURGERY    . TONSILLECTOMY    . wart removal with grafts from scrotum ([place]) in 2009  2009    reports that he has been smoking Cigarettes.  He has a 50.00 pack-year smoking history. He has never used smokeless tobacco. He reports that he drinks about 2.4 oz of alcohol per week . He reports that he does not use drugs. family history is not on file. He was adopted. No Known Allergies  Outpatient Medications Prior to Visit  Medication Sig Dispense Refill  . albuterol (PROVENTIL HFA;VENTOLIN HFA) 108 (90 BASE) MCG/ACT inhaler Inhale 2 puffs into the lungs every 6 (six) hours as needed. 1 Inhaler 11  . Ascorbic Acid (VITAMIN C) 1000 MG tablet Take 1,000 mg by mouth daily.    Marland Kitchen aspirin 81 MG tablet Take 81 mg by mouth daily.      Marland Kitchen atorvastatin (LIPITOR) 80 MG tablet TAKE 1 TABLET ONCE DAILY. 90 tablet 3  . Dietary Management Product (VASCULERA) TABS Take 1 capsule by mouth daily. 30 tablet 11  . potassium chloride SA (K-DUR,KLOR-CON) 20 MEQ tablet Take 1 tablet (20 mEq total) by mouth 3 (three) times daily. 180 tablet 1  . SPIRIVA HANDIHALER 18 MCG inhalation capsule INHALE CONTENTS OF 1 CAPSULE DAILY AS DIRECTED 30 capsule 11  . SYMBICORT 160-4.5 MCG/ACT inhaler USE 2 PUFFS TWICE DAILY. 10.2 g 11  . furosemide (LASIX) 40 MG tablet Take 1 tablet (40 mg total) by  mouth 2 (two) times daily. 60 tablet 5   No facility-administered medications prior to visit.     ROS Review of Systems  Constitutional: Positive for fatigue and unexpected weight change (some wt loss). Negative for activity change, appetite change and chills.  HENT: Negative.  Negative for trouble swallowing and voice change.   Eyes: Negative.   Respiratory: Positive for shortness of breath. Negative for cough, choking, chest tightness, wheezing and stridor.        He has a stable degree of shortness of breath  Cardiovascular: Negative.  Negative for chest pain, palpitations and leg swelling.  Gastrointestinal: Negative.  Negative for abdominal pain, constipation, diarrhea, nausea and vomiting.  Endocrine: Negative.   Genitourinary: Negative.  Negative for difficulty urinating, discharge, dysuria, penile pain, penile swelling, scrotal swelling, testicular pain and urgency.  Musculoskeletal: Negative.  Negative for arthralgias, back pain, myalgias and neck pain.  Skin: Negative.  Negative for color change and rash.  Allergic/Immunologic: Negative.   Neurological: Negative.  Negative for dizziness, weakness, light-headedness and numbness.  Hematological: Negative.  Negative for adenopathy. Does not bruise/bleed easily.  Psychiatric/Behavioral: Negative.     Objective:  BP (!) 148/86 (BP Location: Left Arm, Patient Position: Sitting, Cuff Size: Normal)   Pulse 100   Temp  98.1 F (36.7 C)   Ht 6\' 2"  (1.88 m)   Wt 185 lb (83.9 kg)   SpO2 98%   BMI 23.75 kg/m   BP Readings from Last 3 Encounters:  02/16/16 (!) 148/86  01/04/16 128/82  08/04/15 (!) 160/70    Wt Readings from Last 3 Encounters:  02/16/16 185 lb (83.9 kg)  01/04/16 189 lb (85.7 kg)  08/04/15 203 lb (92.1 kg)    Physical Exam  Constitutional: He is oriented to person, place, and time. He appears well-developed and well-nourished. No distress.  HENT:  Mouth/Throat: Oropharynx is clear and moist. No  oropharyngeal exudate.  Eyes: Conjunctivae are normal. Right eye exhibits no discharge. Left eye exhibits no discharge. No scleral icterus.  Neck: Normal range of motion. Neck supple. No JVD present. No tracheal deviation present. No thyromegaly present.  Cardiovascular: Normal rate, regular rhythm, normal heart sounds and intact distal pulses.  Exam reveals no gallop and no friction rub.   No murmur heard. Pulmonary/Chest: Effort normal and breath sounds normal. No stridor. No respiratory distress. He has no wheezes. He has no rales. He exhibits no tenderness.  Abdominal: Soft. Bowel sounds are normal. He exhibits no distension and no mass. There is no tenderness. There is no rebound and no guarding.  Genitourinary:  Genitourinary Comments: GU and rectal exams were deferred at his request  Musculoskeletal: Normal range of motion. He exhibits edema (trace pitting edema in BLE). He exhibits no tenderness or deformity.  Lymphadenopathy:    He has no cervical adenopathy.  Neurological: He is oriented to person, place, and time.  Skin: Skin is warm and dry. No rash noted. He is not diaphoretic. No erythema. No pallor.  Vitals reviewed.   Lab Results  Component Value Date   WBC 6.3 02/16/2016   HGB 14.9 02/16/2016   HCT 43.4 02/16/2016   PLT 192.0 02/16/2016   GLUCOSE 92 02/16/2016   CHOL 180 02/16/2016   TRIG 47.0 02/16/2016   HDL 130.00 02/16/2016   LDLCALC 41 02/16/2016   ALT 85 (H) 02/16/2016   AST 129 (H) 02/16/2016   NA 138 02/16/2016   K 3.6 02/16/2016   CL 95 (L) 02/16/2016   CREATININE 0.93 02/16/2016   BUN 11 02/16/2016   CO2 33 (H) 02/16/2016   TSH 1.37 02/16/2016   PSA 1.86 02/16/2016   HGBA1C 5.2 01/04/2016    Dg Ribs Unilateral W/chest Left  Result Date: 12/29/2014 CLINICAL DATA:  Fall, left lower anterior chest wall pain EXAM: LEFT RIBS AND CHEST - 3+ VIEW COMPARISON:  09/24/2014 FINDINGS: Calcified hilar lymph nodes and bilateral pulmonary granulomas reidentified.  Heart size normal. No pleural effusion. Linear lucency suggests nondisplaced fracture of the left lateral seventh rib. On the second view, there is also likely nondisplaced fracture of the left lateral eighth rib. No pneumothorax. IMPRESSION: Nondisplaced fractures, left lateral seventh and eighth ribs. Electronically Signed   By: Conchita Paris M.D.   On: 12/29/2014 11:24    Assessment & Plan:   Jasan was seen today for annual exam.  Diagnoses and all orders for this visit:  Essential hypertension- his BP is adequately well controlled, lytes and renal fxn are stable -     Comprehensive metabolic panel; Future -     CBC with Differential/Platelet; Future -     Urinalysis, Routine w reflex microscopic (not at [facility]); Future -     furosemide (LASIX) 40 MG tablet; Take 1 tablet (40 mg total) by mouth 2 (two) times  daily.  Benign prostatic hyperplasia without lower urinary tract symptoms- PSA remains low and he has no concerning symptoms -     PSA; Future  Hyperglycemia- improvement noted -     Comprehensive metabolic panel; Future  Hyperlipidemia with target LDL less than 130- he has achieved his LDL goal and is doing well on the statin -     Lipid panel; Future -     TSH; Future  TOBACCO USE- will screen for lung cancer -     Ambulatory Referral for Lung Cancer Scre  LFTs abnormal- Hep C ab is negative, Will look at an U/S to screen for fatty liver -     Comprehensive metabolic panel; Future -     Hepatitis C antibody; Future -     US Abdomen Complete; Future  Edema, unspecified type- he is doing well on lasix -     furosemide (LASIX) 40 MG tablet; Take 1 tablet (40 mg total) by mouth 2 (two) times daily.   I am having Mr. Speirs maintain his aspirin, albuterol, vitamin C, VASCULERA, atorvastatin, SPIRIVA HANDIHALER, potassium chloride SA, SYMBICORT, and furosemide.  Meds ordered this encounter  Medications  . furosemide (LASIX) 40 MG tablet    Sig: Take 1 tablet (40 mg total) by  mouth 2 (two) times daily.    Dispense:  60 tablet    Refill:  5   See AVS for instructions about healthy living and anticipatory guidance.  Follow-up: Return in about 6 months (around 08/16/2016).  Scarlette Calico, MD

## 2016-02-16 NOTE — Patient Instructions (Signed)

## 2016-02-16 NOTE — Progress Notes (Signed)
Pre visit review using our clinic review tool, if applicable. No additional management support is needed unless otherwise documented below in the visit note. 

## 2016-02-17 ENCOUNTER — Other Ambulatory Visit: Payer: Self-pay | Admitting: Internal Medicine

## 2016-02-17 ENCOUNTER — Encounter: Payer: Self-pay | Admitting: Internal Medicine

## 2016-02-17 DIAGNOSIS — R609 Edema, unspecified: Secondary | ICD-10-CM

## 2016-02-17 DIAGNOSIS — I10 Essential (primary) hypertension: Secondary | ICD-10-CM

## 2016-02-17 LAB — HEPATITIS C ANTIBODY: HCV AB: NEGATIVE

## 2016-02-17 MED ORDER — FUROSEMIDE 40 MG PO TABS: 40.0000 mg | ORAL_TABLET | Freq: Two times a day (BID) | ORAL | 5 refills | Status: AC

## 2016-02-18 NOTE — Assessment & Plan Note (Signed)

## 2016-03-03 ENCOUNTER — Ambulatory Visit
Admission: RE | Admit: 2016-03-03 | Discharge: 2016-03-03 | Disposition: A | Discharge status: Home / Self Care | Payer: PPO | Source: Ambulatory Visit | Attending: Internal Medicine | Admitting: Internal Medicine

## 2016-03-03 DIAGNOSIS — R7989 Other specified abnormal findings of blood chemistry: Secondary | ICD-10-CM

## 2016-03-03 DIAGNOSIS — R945 Abnormal results of liver function studies: Secondary | ICD-10-CM

## 2016-05-01 ENCOUNTER — Encounter: Payer: Self-pay | Admitting: *Deleted

## 2016-05-02 ENCOUNTER — Other Ambulatory Visit: Payer: Self-pay | Admitting: Acute Care

## 2016-05-02 DIAGNOSIS — F17210 Nicotine dependence, cigarettes, uncomplicated: Secondary | ICD-10-CM

## 2016-05-05 ENCOUNTER — Encounter: Payer: Self-pay | Admitting: Acute Care

## 2016-05-05 ENCOUNTER — Ambulatory Visit (INDEPENDENT_AMBULATORY_CARE_PROVIDER_SITE_OTHER): Payer: PPO | Admitting: Acute Care

## 2016-05-05 ENCOUNTER — Ambulatory Visit: Admission: RE | Admit: 2016-05-05 | Payer: PPO | Source: Ambulatory Visit

## 2016-05-05 DIAGNOSIS — F17210 Nicotine dependence, cigarettes, uncomplicated: Secondary | ICD-10-CM

## 2016-05-05 NOTE — Progress Notes (Signed)
Shared Decision Making Visit Lung Cancer Screening Program [phone])   Eligibility:  Age 76 y.o.  Pack Years Smoking History Calculation 37.9 pack years (# packs/per year x # years smoked)  Recent History of coughing up blood  no  Unexplained weight loss? no ( >Than 15 pounds within the last 6 months )  Prior History Lung / other cancer no (Diagnosis within the last 5 years already requiring surveillance chest CT Scans).  Smoking Status Current Smoker  Former Smokers: Years since quit: NA  Quit Date: NA  Visit Components:  Discussion included one or more decision making aids. yes  Discussion included risk/benefits of screening. yes  Discussion included potential follow up diagnostic testing for abnormal scans. yes  Discussion included meaning and risk of over diagnosis. yes  Discussion included meaning and risk of False Positives. yes  Discussion included meaning of total radiation exposure. yes  Counseling Included:  Importance of adherence to annual lung cancer LDCT screening. yes  Impact of comorbidities on ability to participate in the program. yes  Ability and willingness to under diagnostic treatment. yes  Smoking Cessation Counseling:  Current Smokers:   Discussed importance of smoking cessation. yes  Information about tobacco cessation classes and interventions provided to patient. yes  Patient provided with "ticket" for LDCT Scan. yes  Symptomatic Patient. no  Counseling  Diagnosis Code: Tobacco Use Z72.0  Asymptomatic Patient yes  Counseling (Intermediate counseling: > three minutes counseling) [id number]  Former Smokers:   Discussed the importance of maintaining cigarette abstinence. yes  Diagnosis Code: Personal History of Nicotine Dependence. [id number]  Information about tobacco cessation classes and interventions provided to patient. Yes  Patient provided with "ticket" for LDCT Scan. yes  Written Order for Lung Cancer Screening with LDCT  placed in Epic. Yes (CT Chest Lung Cancer Screening Low Dose W/O CM) [id number] Z12.2-Screening of respiratory organs Z87.891-Personal history of nicotine dependence  I have spent 20 minutes of face to face time with [name] discussing the risks and benefits of lung cancer screening. We viewed a power point together that explained in detail the above noted topics. We paused at intervals to allow for questions to be asked and answered to ensure understanding.We discussed that the single most powerful action that he can take to decrease his risk of developing lung cancer is to quit smoking. We discussed whether or not he is ready to commit to setting a quit date. He is currently not ready to set a quit date. We discussed options for tools to aid in quitting smoking including nicotine replacement therapy, non-nicotine medications, support groups, Quit Smart classes, and behavior modification. We discussed that often times setting smaller, more achievable goals, such as eliminating 1 cigarette a day for a week and then 2 cigarettes a day for a week can be helpful in slowly decreasing the number of cigarettes smoked. This allows for a sense of accomplishment as well as providing a clinical benefit. I gave him the " Be Stronger Than Your Excuses" card with contact information for community resources, classes, free nicotine replacement therapy, and access to mobile apps, text messaging, and on-line smoking cessation help. I have also given him my card and contact information in the event he needs to contact me. We discussed the time and location of the scan, and that either June Leap, CMA, or I will call with the results within 24-48 hours of receiving them. I have provided him with a copy of the power point we viewed  as a  resource in the event they need reinforcement of the concepts we discussed today in the office. The patient verbalized understanding of all of  the above and had no further questions upon leaving  the office. They have my contact information in the event they have any further questions.  We discussed that Coronary artery disease is a very common finding in these scans.[name] is currently taking a statin and has his cholesterol and triglycerides checked on a regular basis by his PCP. He verbalized understanding of the above.   Magdalen Spatz, NP 05/05/2016

## 2016-05-19 ENCOUNTER — Ambulatory Visit (INDEPENDENT_AMBULATORY_CARE_PROVIDER_SITE_OTHER)
Admission: RE | Admit: 2016-05-19 | Discharge: 2016-05-19 | Disposition: A | Discharge status: Home / Self Care | Payer: PPO | Source: Ambulatory Visit | Attending: Acute Care | Admitting: Acute Care

## 2016-05-19 DIAGNOSIS — F17210 Nicotine dependence, cigarettes, uncomplicated: Secondary | ICD-10-CM

## 2016-05-19 DIAGNOSIS — Z8781 Personal history of (healed) traumatic fracture: Secondary | ICD-10-CM | POA: Diagnosis not present

## 2016-05-19 DIAGNOSIS — Z87891 Personal history of nicotine dependence: Secondary | ICD-10-CM | POA: Diagnosis not present

## 2016-05-24 ENCOUNTER — Other Ambulatory Visit: Payer: Self-pay | Admitting: Acute Care

## 2016-05-24 ENCOUNTER — Telehealth: Payer: Self-pay | Admitting: Acute Care

## 2016-05-24 DIAGNOSIS — R9389 Abnormal findings on diagnostic imaging of other specified body structures: Secondary | ICD-10-CM

## 2016-05-24 NOTE — Telephone Encounter (Signed)
I have called [name] with the results of his low-dose screening CT. His scan was read as a Lung RADS 4 A : suspicious findings, either short term follow up in 3 months or alternatively  PET Scan evaluation may be considered when there is a solid component of  8 mm or larger. I reviewed the scan with Dr. Lamonte Sakai, and together we decided the best option was for a PET scan at this point. I explained this to the patient who verbalized understanding. I explained that I will order the PET scan and that he will receive a call from a scheduler  to arrange details. He verbalized understanding, and had no questions at completion of the call. He does have my contact information in the event he has any further questions.

## 2016-06-07 ENCOUNTER — Telehealth: Payer: Self-pay | Admitting: Acute Care

## 2016-06-07 ENCOUNTER — Ambulatory Visit (HOSPITAL_COMMUNITY)
Admission: RE | Admit: 2016-06-07 | Discharge: 2016-06-07 | Disposition: A | Discharge status: Home / Self Care | Payer: PPO | Source: Ambulatory Visit | Attending: Acute Care | Admitting: Acute Care

## 2016-06-07 ENCOUNTER — Other Ambulatory Visit: Payer: Self-pay | Admitting: Acute Care

## 2016-06-07 DIAGNOSIS — K760 Fatty (change of) liver, not elsewhere classified: Secondary | ICD-10-CM | POA: Diagnosis not present

## 2016-06-07 DIAGNOSIS — I2510 Atherosclerotic heart disease of native coronary artery without angina pectoris: Secondary | ICD-10-CM | POA: Diagnosis not present

## 2016-06-07 DIAGNOSIS — I6529 Occlusion and stenosis of unspecified carotid artery: Secondary | ICD-10-CM | POA: Insufficient documentation

## 2016-06-07 DIAGNOSIS — I700 Atherosclerosis of aorta: Secondary | ICD-10-CM | POA: Insufficient documentation

## 2016-06-07 DIAGNOSIS — R9389 Abnormal findings on diagnostic imaging of other specified body structures: Secondary | ICD-10-CM

## 2016-06-07 DIAGNOSIS — K5730 Diverticulosis of large intestine without perforation or abscess without bleeding: Secondary | ICD-10-CM | POA: Insufficient documentation

## 2016-06-07 DIAGNOSIS — R942 Abnormal results of pulmonary function studies: Secondary | ICD-10-CM

## 2016-06-07 DIAGNOSIS — J432 Centrilobular emphysema: Secondary | ICD-10-CM | POA: Insufficient documentation

## 2016-06-07 DIAGNOSIS — R911 Solitary pulmonary nodule: Secondary | ICD-10-CM | POA: Diagnosis not present

## 2016-06-07 DIAGNOSIS — R938 Abnormal findings on diagnostic imaging of other specified body structures: Secondary | ICD-10-CM | POA: Diagnosis not present

## 2016-06-07 LAB — GLUCOSE, CAPILLARY: GLUCOSE-CAPILLARY: 105 mg/dL — AB (ref 65–99)

## 2016-06-07 MED ORDER — FLUDEOXYGLUCOSE F - 18 (FDG) INJECTION
9.2100 | Freq: Once | INTRAVENOUS | Status: AC | PRN
  Administered 2016-06-07: 9.21 via INTRAVENOUS

## 2016-06-07 NOTE — Telephone Encounter (Signed)
I tried to call [name] with the results of his PET Scan. He did not answer. I have left a message asking that he return my call . I also told him I have ordered a PFT, and that someone may be calling him to schedule it.I will await his return call.

## 2016-06-15 ENCOUNTER — Encounter: Payer: Self-pay | Admitting: Gastroenterology

## 2016-06-15 ENCOUNTER — Ambulatory Visit (INDEPENDENT_AMBULATORY_CARE_PROVIDER_SITE_OTHER): Payer: PPO | Admitting: Emergency Medicine

## 2016-06-15 ENCOUNTER — Encounter: Payer: Self-pay | Admitting: Emergency Medicine

## 2016-06-15 ENCOUNTER — Ambulatory Visit (HOSPITAL_COMMUNITY)
Admission: RE | Admit: 2016-06-15 | Discharge: 2016-06-15 | Disposition: A | Discharge status: Home / Self Care | Payer: PPO | Source: Ambulatory Visit | Attending: Acute Care | Admitting: Acute Care

## 2016-06-15 VITALS — BP 128/80 | HR 90 | Ht 74.0 in | Wt 191.4 lb

## 2016-06-15 DIAGNOSIS — J449 Chronic obstructive pulmonary disease, unspecified: Secondary | ICD-10-CM

## 2016-06-15 DIAGNOSIS — R938 Abnormal findings on diagnostic imaging of other specified body structures: Secondary | ICD-10-CM | POA: Diagnosis not present

## 2016-06-15 DIAGNOSIS — K635 Polyp of colon: Secondary | ICD-10-CM | POA: Diagnosis not present

## 2016-06-15 DIAGNOSIS — R911 Solitary pulmonary nodule: Secondary | ICD-10-CM

## 2016-06-15 DIAGNOSIS — R942 Abnormal results of pulmonary function studies: Secondary | ICD-10-CM | POA: Diagnosis not present

## 2016-06-15 DIAGNOSIS — R9389 Abnormal findings on diagnostic imaging of other specified body structures: Secondary | ICD-10-CM

## 2016-06-15 LAB — PULMONARY FUNCTION TEST
DL/VA % PRED: 49 %
DL/VA: 2.37 ml/min/mmHg/L
DLCO UNC % PRED: 41 %
DLCO unc: 15.65 ml/min/mmHg
FEF 25-75 POST: 0.64 L/s
FEF 25-75 PRE: 0.52 L/s
FEF2575-%Change-Post: 24 %
FEF2575-%PRED-PRE: 20 %
FEF2575-%Pred-Post: 25 %
FEV1-%Change-Post: 6 %
FEV1-%PRED-POST: 50 %
FEV1-%Pred-Pre: 47 %
FEV1-PRE: 1.66 L
FEV1-Post: 1.77 L
FEV1FVC-%CHANGE-POST: 0 %
FEV1FVC-%PRED-PRE: 64 %
FEV6-%CHANGE-POST: 6 %
FEV6-%PRED-PRE: 66 %
FEV6-%Pred-Post: 70 %
FEV6-Post: 3.23 L
FEV6-Pre: 3.03 L
FEV6FVC-%Change-Post: 0 %
FEV6FVC-%Pred-Post: 90 %
FEV6FVC-%Pred-Pre: 91 %
FVC-%CHANGE-POST: 6 %
FVC-%PRED-PRE: 73 %
FVC-%Pred-Post: 78 %
FVC-POST: 3.8 L
FVC-PRE: 3.56 L
POST FEV1/FVC RATIO: 47 %
PRE FEV1/FVC RATIO: 47 %
Post FEV6/FVC ratio: 85 %
Pre FEV6/FVC Ratio: 86 %
RV % pred: 152 %
RV: 4.3 L
TLC % pred: 102 %
TLC: 8.04 L

## 2016-06-15 MED ORDER — ALBUTEROL SULFATE (2.5 MG/3ML) 0.083% IN NEBU
2.5000 mg | INHALATION_SOLUTION | Freq: Once | RESPIRATORY_TRACT | Status: AC
  Administered 2016-06-15: 2.5 mg via RESPIRATORY_TRACT

## 2016-06-15 NOTE — Patient Instructions (Signed)
We will arrange for you to see gastroenterology to discuss colonoscopy given the abnormalities on your PET scan  We will ask Interventional radiology to evaluate your for a needle biopsy of your left upper lobe nodule.  We will perform a walking oximetry today on room air.  Continue your Spiriva and Symbicort as you are taking them  Follow with Dr Lamonte Sakai next available to plan next steps in your treatment.

## 2016-06-15 NOTE — Progress Notes (Signed)
Subjective:    Patient ID: [name], male    DOB: 12/15/1940, 76 y.o.   MRN: [id number]  HPI 76 year old man with a history of active tobacco use (120 pk-yrs), COPD, hypertension, BCC on skin of the nose. He presents today for initial evaluation of of a 2.4 x 2.2 cm spiiculated left upper lobe mass found on LDCT screening exam from 05/19/16. Given this finding we have obtained a PET scan done on 06/07/16 that I have personally reviewed. This shows that the nodular opacity is hypermetabolic with the maximum SUV of 5.0. The PET scan also showed 2 areas of hypermetabolism in the bowel that were suggestive of possible polyps but which could have been physiologic. We arranged for PFT today to assess his surgical potential. I have reviewed > insistent with severe obstruction, FEV1 1.66 L (47% predicted), hyperinflation, decreased diffusion capacity (49% predicted corrected for alveolar volume). He is managed with Spiriva and Symbicort, has been on for many years.   He has some limitations on activity, not really due to breathing - does not get dyspneic.  occasional cough.   Review of Systems As per HPI  Past Medical History:  Diagnosis Date  . COPD (chronic obstructive pulmonary disease) ([place])   . Genital warts 2009  . Hyperlipidemia   . Hypertension   . Osteoarthritis   . Skin cancer    history of: BCC on nose     Family History  Problem Relation Age of Onset  . Adopted: Yes  . Alcohol abuse Neg Hx   . Cancer Neg Hx   . Heart disease Neg Hx   . Hyperlipidemia Neg Hx   . Hypertension Neg Hx   . Stroke Neg Hx      Social History   Social History  . Marital status: Widowed    Spouse name: N/A  . Number of children: N/A  . Years of education: N/A   Occupational History  . Not on file.   Social History Main Topics  . Smoking status: Current Every Day Smoker    Packs/day: 1.00    Years: 54.00    Types: Cigarettes  . Smokeless tobacco: Never Used  . Alcohol use 2.4 oz/week      4 Cans of beer per week     Comment: Scotch varies; somes days he drinks and some day he does not  . Drug use: No  . Sexual activity: Yes   Other Topics Concern  . Not on file   Social History Narrative   Daily Caffeine Use: 5 cups daily   Regular exercise-No   Retired   Married     No Known Allergies   Outpatient Medications Prior to Visit  Medication Sig Dispense Refill  . Ascorbic Acid (VITAMIN C) 1000 MG tablet Take 1,000 mg by mouth daily.    Marland Kitchen aspirin 81 MG tablet Take 81 mg by mouth daily.      Marland Kitchen atorvastatin (LIPITOR) 80 MG tablet TAKE 1 TABLET ONCE DAILY. 90 tablet 3  . Dietary Management Product (VASCULERA) TABS Take 1 capsule by mouth daily. 30 tablet 11  . furosemide (LASIX) 40 MG tablet Take 1 tablet (40 mg total) by mouth 2 (two) times daily. 60 tablet 5  . potassium chloride SA (K-DUR,KLOR-CON) 20 MEQ tablet Take 1 tablet (20 mEq total) by mouth 3 (three) times daily. 180 tablet 1  . SPIRIVA HANDIHALER 18 MCG inhalation capsule INHALE CONTENTS OF 1 CAPSULE DAILY AS DIRECTED 30 capsule 11  .  SYMBICORT 160-4.5 MCG/ACT inhaler USE 2 PUFFS TWICE DAILY. 10.2 g 11  . albuterol (PROVENTIL HFA;VENTOLIN HFA) 108 (90 BASE) MCG/ACT inhaler Inhale 2 puffs into the lungs every 6 (six) hours as needed. 1 Inhaler 11   No facility-administered medications prior to visit.         Objective:   Physical Exam Vitals:   06/15/16 1433  BP: 128/80  Pulse: 90  SpO2: 97%  Weight: 191 lb 6.4 oz (86.8 kg)  Height: 6\' 2"  (1.88 m)   Gen: Pleasant, thin, in no distress,  normal affect  ENT: No lesions,  mouth clear,  oropharynx clear, no postnasal drip  Neck: No JVD, no stridor  Lungs: No use of accessory muscles, distant, very mild wheeze on a forced exp.   Cardiovascular: RRR, heart sounds normal, no murmur or gallops, no peripheral edema  Musculoskeletal: No deformities, no cyanosis or clubbing  Neuro: alert, non focal  Skin: Warm, no lesions or rashes      Assessment & Plan:  COPD (chronic obstructive pulmonary disease) (HCC) Severe obstruction based on PFT today. He does not exacerbate frequently. Will assess for hypoxemia today with walking oximetry.  continue current BD's as ordered.   Nodule of left lung Spiculated left upper lobe nodule that is hypermetabolic on PET scan. Based on the current evaluation he may be a candidate for primary surgical resection. I believe he needs a colonoscopy first to evaluate a hypermetabolic regions in his colon. On discussing the options with him I believe he would prefer to have a biopsy first before going straight to surgery. This is a reasonable approach. I offered to refer him to thoracic surgery to talk about the options before forming any procedure. He would like to go ahead with a needle biopsy first. I will refer him to interventional radiology to get this done. In the meantime he can get the CSY. He may ultimately be a referral for primary lobectomy. Walking oximetry today to assess COPD further, risk stratify for sgy.   Baltazar Apo, MD, PhD 06/15/2016, 3:09 PM [facility] [phone] or if no answer [phone] '

## 2016-06-15 NOTE — Assessment & Plan Note (Signed)
Severe obstruction based on PFT today. He does not exacerbate frequently. Will assess for hypoxemia today with walking oximetry.  continue current BD's as ordered.

## 2016-06-15 NOTE — Assessment & Plan Note (Signed)
Spiculated left upper lobe nodule that is hypermetabolic on PET scan. Based on the current evaluation he may be a candidate for primary surgical resection. I believe he needs a colonoscopy first to evaluate a hypermetabolic regions in his colon. On discussing the options with him I believe he would prefer to have a biopsy first before going straight to surgery. This is a reasonable approach. I offered to refer him to thoracic surgery to talk about the options before forming any procedure. He would like to go ahead with a needle biopsy first. I will refer him to interventional radiology to get this done. In the meantime he can get the CSY. He may ultimately be a referral for primary lobectomy. Walking oximetry today to assess COPD further, risk stratify for sgy.

## 2016-07-02 ENCOUNTER — Telehealth: Payer: Self-pay | Admitting: Pulmonary Disease

## 2016-07-04 ENCOUNTER — Telehealth: Payer: Self-pay

## 2016-07-04 NOTE — Telephone Encounter (Signed)
On 07/04/2016 I received a death certificate from [employer] (original). The death certificate is for cremation. The patient is a patient of Doctor Byrum.  The death certificate will be taken to Pulmonary Unit @ [facility] this am for signature.  On 2016/07/18 I received the death certificate back from Doctor Byrum. I got the death certificate ready and called the funeral home to let them know the death certificate is ready for pickup. I also faxed a copy to the funeral home per the funeral home request.

## 2016-07-14 ENCOUNTER — Ambulatory Visit (HOSPITAL_COMMUNITY): Payer: PPO

## 2016-07-19 ENCOUNTER — Ambulatory Visit: Payer: PPO | Admitting: Gastroenterology

## 2016-07-23 DIAGNOSIS — 419620001 Death: Secondary | SNOMED CT | POA: Diagnosis not present

## 2016-07-23 NOTE — Telephone Encounter (Signed)
Called by [facility] about [name] who was found dead at home. Patient has been followed by Dr. Lamonte Sakai and [facility] was looking for someone to sign the patient's death certificate. Since I have never seen the patient, I told them that I did not feel comfortable signing the patient's death certificate. The have also called the patient's primary physician who hasn't called them back. I suggested that they call the PCCM office in the morning to see if Dr. Lamonte Sakai would be willing to sign the patient's death certificate. [facility] did  not seem to be pleased by this suggestion. I then suggested that the patient be a Coroner's Case.

## 2016-07-23 DEATH — deceased

## 2016-08-01 ENCOUNTER — Ambulatory Visit: Payer: PPO | Admitting: Emergency Medicine
